# Patient Record
Sex: Female | Born: 1955 | Race: Black or African American | Hispanic: No | Marital: Married | Smoking: Current some day smoker
Health system: Southern US, Community
[De-identification: ages and names within clinical notes are randomized; demographics above are authoritative.]

## PROBLEM LIST (undated history)

## (undated) DIAGNOSIS — J45909 Unspecified asthma, uncomplicated: Secondary | ICD-10-CM

## (undated) DIAGNOSIS — G4730 Sleep apnea, unspecified: Secondary | ICD-10-CM

## (undated) DIAGNOSIS — E7800 Pure hypercholesterolemia, unspecified: Secondary | ICD-10-CM

## (undated) DIAGNOSIS — K219 Gastro-esophageal reflux disease without esophagitis: Secondary | ICD-10-CM

## (undated) DIAGNOSIS — K929 Disease of digestive system, unspecified: Secondary | ICD-10-CM

## (undated) DIAGNOSIS — M2550 Pain in unspecified joint: Secondary | ICD-10-CM

## (undated) DIAGNOSIS — I10 Essential (primary) hypertension: Secondary | ICD-10-CM

## (undated) DIAGNOSIS — E739 Lactose intolerance, unspecified: Secondary | ICD-10-CM

## (undated) HISTORY — PX: TUBAL LIGATION: SHX77

## (undated) HISTORY — DX: Pain in unspecified joint: M25.50

## (undated) HISTORY — PX: TONSILLECTOMY: SUR1361

## (undated) HISTORY — DX: Essential (primary) hypertension: I10

## (undated) HISTORY — DX: Lactose intolerance, unspecified: E73.9

## (undated) HISTORY — PX: ESOPHAGOGASTRODUODENOSCOPY: SHX1529

## (undated) HISTORY — DX: Unspecified asthma, uncomplicated: J45.909

## (undated) HISTORY — DX: Gastro-esophageal reflux disease without esophagitis: K21.9

## (undated) HISTORY — DX: Disease of digestive system, unspecified: K92.9

## (undated) HISTORY — DX: Sleep apnea, unspecified: G47.30

## (undated) HISTORY — DX: Pure hypercholesterolemia, unspecified: E78.00

---

## 2016-01-23 ENCOUNTER — Other Ambulatory Visit: Payer: Self-pay | Admitting: Endocrinology

## 2016-01-23 DIAGNOSIS — E041 Nontoxic single thyroid nodule: Secondary | ICD-10-CM

## 2016-01-28 ENCOUNTER — Ambulatory Visit
Admission: RE | Admit: 2016-01-28 | Discharge: 2016-01-28 | Disposition: A | Discharge status: Home / Self Care | Payer: Managed Care, Other (non HMO) | Source: Ambulatory Visit | Attending: Endocrinology | Admitting: Endocrinology

## 2016-01-28 DIAGNOSIS — E041 Nontoxic single thyroid nodule: Secondary | ICD-10-CM

## 2016-02-28 ENCOUNTER — Other Ambulatory Visit (HOSPITAL_COMMUNITY)
Admission: RE | Admit: 2016-02-28 | Discharge: 2016-02-28 | Disposition: A | Discharge status: Home / Self Care | Payer: Managed Care, Other (non HMO) | Source: Ambulatory Visit | Attending: Internal Medicine | Admitting: Internal Medicine

## 2016-02-28 DIAGNOSIS — Z01419 Encounter for gynecological examination (general) (routine) without abnormal findings: Secondary | ICD-10-CM | POA: Insufficient documentation

## 2016-09-02 ENCOUNTER — Other Ambulatory Visit: Payer: Self-pay | Admitting: Registered Nurse

## 2016-09-02 DIAGNOSIS — Z1231 Encounter for screening mammogram for malignant neoplasm of breast: Secondary | ICD-10-CM

## 2016-09-16 ENCOUNTER — Ambulatory Visit: Payer: Managed Care, Other (non HMO)

## 2016-10-24 ENCOUNTER — Ambulatory Visit
Admission: RE | Admit: 2016-10-24 | Discharge: 2016-10-24 | Disposition: A | Discharge status: Home / Self Care | Payer: Managed Care, Other (non HMO) | Source: Ambulatory Visit | Attending: Registered Nurse | Admitting: Registered Nurse

## 2016-10-24 DIAGNOSIS — Z1231 Encounter for screening mammogram for malignant neoplasm of breast: Secondary | ICD-10-CM

## 2017-05-11 ENCOUNTER — Other Ambulatory Visit: Payer: Self-pay | Admitting: Endocrinology

## 2017-05-11 DIAGNOSIS — E041 Nontoxic single thyroid nodule: Secondary | ICD-10-CM

## 2017-05-20 ENCOUNTER — Ambulatory Visit
Admission: RE | Admit: 2017-05-20 | Discharge: 2017-05-20 | Disposition: A | Discharge status: Home / Self Care | Payer: Managed Care, Other (non HMO) | Source: Ambulatory Visit | Attending: Endocrinology | Admitting: Endocrinology

## 2017-05-20 DIAGNOSIS — E041 Nontoxic single thyroid nodule: Secondary | ICD-10-CM

## 2017-10-06 ENCOUNTER — Other Ambulatory Visit: Payer: Self-pay | Admitting: Registered Nurse

## 2017-10-06 DIAGNOSIS — Z1231 Encounter for screening mammogram for malignant neoplasm of breast: Secondary | ICD-10-CM

## 2017-11-04 ENCOUNTER — Ambulatory Visit: Payer: Managed Care, Other (non HMO)

## 2017-12-01 ENCOUNTER — Ambulatory Visit
Admission: RE | Admit: 2017-12-01 | Discharge: 2017-12-01 | Disposition: A | Discharge status: Home / Self Care | Payer: Managed Care, Other (non HMO) | Source: Ambulatory Visit | Attending: Registered Nurse | Admitting: Registered Nurse

## 2017-12-01 DIAGNOSIS — Z1231 Encounter for screening mammogram for malignant neoplasm of breast: Secondary | ICD-10-CM

## 2018-11-23 ENCOUNTER — Other Ambulatory Visit: Payer: Self-pay | Admitting: Registered Nurse

## 2018-11-23 DIAGNOSIS — Z1231 Encounter for screening mammogram for malignant neoplasm of breast: Secondary | ICD-10-CM

## 2018-12-23 ENCOUNTER — Ambulatory Visit: Payer: Managed Care, Other (non HMO)

## 2019-01-21 ENCOUNTER — Ambulatory Visit
Admission: RE | Admit: 2019-01-21 | Discharge: 2019-01-21 | Disposition: A | Discharge status: Home / Self Care | Payer: Managed Care, Other (non HMO) | Source: Ambulatory Visit | Attending: Registered Nurse | Admitting: Registered Nurse

## 2019-01-21 ENCOUNTER — Other Ambulatory Visit: Payer: Self-pay

## 2019-01-21 DIAGNOSIS — Z1231 Encounter for screening mammogram for malignant neoplasm of breast: Secondary | ICD-10-CM

## 2019-10-04 ENCOUNTER — Other Ambulatory Visit: Payer: Self-pay

## 2019-10-04 DIAGNOSIS — Z20822 Contact with and (suspected) exposure to covid-19: Secondary | ICD-10-CM

## 2019-10-05 LAB — NOVEL CORONAVIRUS, NAA: SARS-CoV-2, NAA: NOT DETECTED

## 2019-11-15 ENCOUNTER — Ambulatory Visit: Payer: Managed Care, Other (non HMO) | Attending: Internal Medicine

## 2019-11-15 DIAGNOSIS — Z20822 Contact with and (suspected) exposure to covid-19: Secondary | ICD-10-CM

## 2019-11-16 LAB — NOVEL CORONAVIRUS, NAA: SARS-CoV-2, NAA: NOT DETECTED

## 2019-12-21 ENCOUNTER — Telehealth: Payer: Managed Care, Other (non HMO) | Admitting: Family

## 2019-12-21 DIAGNOSIS — Z7189 Other specified counseling: Secondary | ICD-10-CM

## 2019-12-21 NOTE — Progress Notes (Signed)
E-Visit for [employer] Virus Screening    Many health care providers can now test patients at their office but not all are.  [facility] has multiple testing sites. For information on our COVID testing locations and hours go to [email or web address]    Testing Information: The COVID-19 Community Testing sites will begin testing BY APPOINTMENT ONLY.  You can schedule online at [email or web address]  If you do not have access to a smart phone or computer you may call [phone] for an appointment.   Additional testing sites in the Community:  . For [facility] Testing sites in [facility]  [email or web address]  . For Pop-up testing sites in [place]  [email or web address]  . For Testing sites with regular hours [email or web address]  . For [facility] [email or web address]  . For [facility] [email or web address]  . For [facility] testing in [place] and [employer] [email or web address]  . For [facility] testing in [facility]   [email or web address]  For  more information about community testing call [phone]   Please quarantine yourself while awaiting your test results. Please stay home for a minimum of 10 days from the first day of illness with improving symptoms and you have had 24 hours of no fever (without the use of Tylenol (Acetaminophen) Motrin (Ibuprofen) or any fever reducing medication).  Also - Do not get tested prior to returning to work because once you have had a positive test the test can stay positive for more then a month in some cases.   You should wear a mask or cloth face covering  over your nose and mouth if you must be around other people or animals, including pets (even at home). Try to stay at least 6 feet away from other people. This will protect the people around you.  Please continue good preventive care measures, including:  frequent hand-washing, avoid touching your face, cover coughs/sneezes, stay out of crowds and keep a 6 foot distance from others.  COVID-19 is a respiratory illness with symptoms that are similar to the flu. Symptoms are typically mild to moderate, but there have been cases of severe illness and death due to the virus.   The following symptoms may appear 2-14 days after exposure: . Fever . Cough . Shortness of breath or difficulty breathing . Chills . Repeated shaking with chills . Muscle pain . Headache . Sore throat . New loss of taste or smell . Fatigue . Congestion or runny nose . Nausea or vomiting . Diarrhea  Go to the nearest hospital ED for assessment if fever/cough/breathlessness are severe or illness seems like a threat to life.  It is vitally important that if you feel that you have an infection such as this virus or any other virus that you stay home and away from places where you may spread it to others.  You should avoid contact with people age 42 and older.    You may also take acetaminophen (Tylenol) as needed for fever.  Reduce your risk of any infection by using the same precautions used for avoiding the common cold or flu:  Marland Kitchen Wash your hands often with soap and warm water for at least 20 seconds.  If soap and water are not readily available, use an alcohol-based hand sanitizer with at least 60% alcohol.  . If coughing or sneezing, cover your mouth and nose by coughing or sneezing into the elbow areas of your shirt or coat, into a tissue or into your sleeve (not your hands). . Avoid shaking  hands with others and consider head nods or verbal greetings only. . Avoid touching your eyes, nose, or mouth with unwashed hands.   . Avoid close contact with people who are sick. . Avoid places or events with large numbers of people in one location, like concerts or sporting events. . Carefully consider travel plans you have or are making. . If you are planning any travel outside or inside the [place], visit the CDC's Travelers' Health webpage for the latest health notices. . If you have some symptoms but not all symptoms, continue to monitor at home and seek medical attention if your symptoms worsen. . If you are having a medical emergency, call 911.  HOME CARE . Only take medications as instructed by your medical team. . Drink plenty of fluids and get plenty of rest. . A steam or ultrasonic humidifier can help if you have congestion.   GET HELP RIGHT AWAY IF YOU HAVE EMERGENCY WARNING SIGNS** FOR COVID-19. If you or someone is showing any of these signs seek emergency medical care immediately. Call 911 or proceed to your closest emergency facility if: . You develop worsening high fever. . Trouble breathing . Bluish lips or face . Persistent pain or pressure in the chest . New confusion . Inability to wake or stay awake . You cough up blood. . Your symptoms become more severe  **This list is not all possible symptoms. Contact your medical provider for any symptoms that are sever or concerning to you.  MAKE SURE YOU   Understand these instructions.  Will watch your condition.  Will get help right away if you are not doing well or get worse.  Your e-visit answers were reviewed by a board certified advanced clinical practitioner to complete your personal care plan.  Depending on the condition, your plan could have included both over the counter or prescription medications.  If there is a problem please reply once you have received a response from your provider.  Your safety is important to [place].  If you have drug allergies check your prescription carefully.    You can use MyChart to ask questions about today's  visit, request a non-urgent call back, or ask for a work or school excuse for 24 hours related to this e-Visit. If it has been greater than 24 hours you will need to follow up with your provider, or enter a new e-Visit to address those concerns. You will get an e-mail in the next two days asking about your experience.  I hope that your e-visit has been valuable and will speed your recovery. Thank you for using e-visits.  Approximately 5 minutes was spent documenting and reviewing patient's chart.

## 2020-02-21 ENCOUNTER — Other Ambulatory Visit: Payer: Self-pay | Admitting: Registered Nurse

## 2020-02-21 DIAGNOSIS — Z1231 Encounter for screening mammogram for malignant neoplasm of breast: Secondary | ICD-10-CM

## 2020-03-01 ENCOUNTER — Encounter: Payer: Self-pay | Admitting: Neurology

## 2020-03-01 ENCOUNTER — Other Ambulatory Visit: Payer: Self-pay | Admitting: Neurology

## 2020-03-02 ENCOUNTER — Ambulatory Visit
Admission: RE | Admit: 2020-03-02 | Discharge: 2020-03-02 | Disposition: A | Discharge status: Home / Self Care | Payer: Managed Care, Other (non HMO) | Source: Ambulatory Visit | Attending: Registered Nurse | Admitting: Registered Nurse

## 2020-03-02 ENCOUNTER — Other Ambulatory Visit: Payer: Self-pay

## 2020-03-02 DIAGNOSIS — Z1231 Encounter for screening mammogram for malignant neoplasm of breast: Secondary | ICD-10-CM

## 2020-03-05 ENCOUNTER — Institutional Professional Consult (permissible substitution): Payer: Self-pay | Admitting: Neurology

## 2020-03-12 ENCOUNTER — Other Ambulatory Visit: Payer: Self-pay

## 2020-03-12 ENCOUNTER — Ambulatory Visit (INDEPENDENT_AMBULATORY_CARE_PROVIDER_SITE_OTHER): Payer: Managed Care, Other (non HMO) | Admitting: Neurology

## 2020-03-12 ENCOUNTER — Encounter: Payer: Self-pay | Admitting: Neurology

## 2020-03-12 VITALS — BP 132/92 | HR 86 | Temp 97.0°F | Ht 64.5 in | Wt 214.0 lb

## 2020-03-12 DIAGNOSIS — E6609 Other obesity due to excess calories: Secondary | ICD-10-CM | POA: Diagnosis not present

## 2020-03-12 DIAGNOSIS — R0683 Snoring: Secondary | ICD-10-CM | POA: Diagnosis not present

## 2020-03-12 DIAGNOSIS — Z9114 Patient's other noncompliance with medication regimen: Secondary | ICD-10-CM

## 2020-03-12 DIAGNOSIS — G518 Other disorders of facial nerve: Secondary | ICD-10-CM

## 2020-03-12 DIAGNOSIS — E66812 Obesity, class 2: Secondary | ICD-10-CM | POA: Insufficient documentation

## 2020-03-12 DIAGNOSIS — Z91199 Patient's noncompliance with other medical treatment and regimen due to unspecified reason: Secondary | ICD-10-CM | POA: Insufficient documentation

## 2020-03-12 DIAGNOSIS — Z6836 Body mass index (BMI) 36.0-36.9, adult: Secondary | ICD-10-CM

## 2020-03-12 DIAGNOSIS — Z6837 Body mass index (BMI) 37.0-37.9, adult: Secondary | ICD-10-CM | POA: Insufficient documentation

## 2020-03-12 NOTE — Progress Notes (Signed)
SLEEP MEDICINE CLINIC    Provider:  Melvyn Novas, MD  Primary Care Physician:  Fatima Sanger, FNP [address] [place] [address] [place] [address]     Referring Provider: Fatima Sanger, Fnp [address] [place],  [place] [address]          Chief Complaint according to patient   Patient presents with:    . New Patient (Initial Visit)     rm 10. pt states she was established with [facility] and they have closed and she is needed to establish with new sleep MD. Last SS 2017 and she uses a CPAP machine set up 11/05/16, [facility]      HISTORY OF PRESENT ILLNESS:  Danielle Mcdonald is a 64 year old  African American female patient seen here as a new patient on 03/12/2020 from Dr. Renne Crigler.  Chief concern according to patient :" I have not been using my CPAP, which was provided by [facility] and need to either get a new test or new supplies." I am having headache" .    I have the pleasure of seeing Danielle Mcdonald 03-12-2020, a right -handed Black or [place] American female with a possible sleep disorder.  She  has no past medical history on file.. She reports that she underwent a sleep study and at [facility] location that belonged to [facility] at the time at that location also closed with in the year of her test.  She must have been diagnosed with obstructive sleep apnea as she was prescribed CPAP in 2017.  To [place] here at [facility] sleep she is a new patient.  She has used over the last 30 days the machine 13 days only and in the last 90 days there was a compliance of about 13%.  This is an air sense 10 machine which is an older model but now the CPAP is set to a pressure of 8 cmH2O was 2 cm EPR and on days used the patient usually vented for 8 hours up to 8 hours and 30 minutes.  The residual AHI is excellent 2.1/h which means that the machine still seems to treat the apnea very well.    There is only minor air leakage noted-  she is using nasal  pillows as an interface.  She reports no daytime sleepiness and very little fatigue, no clinical signs of depression were endorsed, I was able to review Dr. Dorthula Nettles notes which state that the patient has seen her for him for cervicalgia and that he believes that it could be related to her obstructive sleep apnea not being treated at this time.  The patient reports that she still gets supplies from [facility].  White blood cell count in the last documented test was 8.4 hemoglobin hematocrit 11 point 0.7/36.3 platelet count 290 5K comprehensive metabolic panel normal for glucose electrolytes kidney function was intact, normal liver function tests AST and ALT as well as bilirubin, hemoglobin A1c was measured at 5.4 which is normal cholesterol total was 189 which is normal and a urine screen was negative for protein or bacteria or leukocytes.    Sleep relevant medical history: Nocturia: twice, Sleep walking; none, had a Tonsillectomy at age 20. Family medical /sleep history: no other family member diagnosed  with OSA.   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 Family status is married, with  an adult daughter , 16, and 2 grandchildren in [place] . The patient lives with her eldest granddaughter - attending [employer]. Pets are present - one rabbitt.  Tobacco use: 5 cigarettes a month.  ETOH use: none  ,  Caffeine intake in form of Coffee( 1 cup in AM 16 ounces.) Soda( none) Tea ( none ) nor energy drinks. No regular exercise. Hobbies : gardening  Sleep habits are as follows: The patient's dinner time is between 6 PM. The patient goes to bed at 10.15 PM and is immediately asleep, she  continues to sleep for many hours, wakes for 2-5 bathroom breaks, the first time at 2 AM.   The preferred sleep position is on her sides and prone, with the support of 2 pillows.  Dreams are reportedly Infrequent.  7.30AM is the usual rise time. The  patient wakes up spontaneously  At 7  She reports feeling refreshed or restored in AM, with symptoms such as dry mouth , morning headaches ( when not using CPAP ) , and residual fatigue.  Naps are not taken /  Review of Systems: Out of a complete 14 system review, the patient complains of only the following symptoms, and all other reviewed systems are negative.:  Fatigue, sleepiness , snoring, fragmented sleep, Nocturia. Flatulence and burping.   How likely are you to doze in the following situations: 0 = not likely, 1 = slight chance, 2 = moderate chance, 3 = high chance   Sitting and Reading? Watching Television? Sitting inactive in a public place (theater or meeting)? As a passenger in a car for an hour without a break? Lying down in the afternoon when circumstances permit? Sitting and talking to someone? Sitting quietly after lunch without alcohol? In a car, while stopped for a few minutes in traffic?   Total = 0/ 24 points   FSS endorsed at 13/ 63 points.   Social History   Socioeconomic History  . Marital status: Married    Spouse name: Not on file  . Number of children: Not on file  . Years of education: Not on file  . Highest education level: Not on file  Occupational History  . Not on file  Tobacco Use  . Smoking status: Current Some Day Smoker    Types: Cigarettes  . Smokeless tobacco: Never Used  . Tobacco comment: once a week  Substance and Sexual Activity  . Alcohol use: Not Currently    Comment: rare   . Drug use: Never  . Sexual activity: Not on file  Other Topics Concern  . Not on file  Social History Narrative  . Not on file   Social Determinants of Health   Financial Resource Strain:   . Difficulty of Paying Living Expenses:   Food Insecurity:   . Worried About Charity fundraiser in the Last Year:   . Arboriculturist in the Last Year:   Transportation Needs:   . Film/video editor (Medical):   Marland Kitchen Lack of Transportation (Non-Medical):    Physical Activity:   . Days of Exercise per Week:   . Minutes of Exercise per Session:   Stress:   . Feeling of Stress : mother is 29 and living in [place].   Social Connections:   . Frequency of Communication with Friends and Family:   . Frequency of Social Gatherings with Friends and Family:   . Attends Religious Services:   . Active Member of Clubs or Organizations:   . Attends  Club or Organization Meetings:   Marland Kitchen Marital Status: married, adult daughter, 3 grandchildren.     Family History  Problem Relation Age of Onset  . Thyroid disease Mother   . Diabetes Mother   . Osteoarthritis Mother   . Hypertension Mother   . Heart disease Mother   . Lung cancer Father     No past medical history on file.  Past Surgical History:  Procedure Laterality Date  . TONSILLECTOMY    . TUBAL LIGATION       Current Outpatient Medications on File Prior to Visit  Medication Sig Dispense Refill  . albuterol (VENTOLIN HFA) 108 (90 Base) MCG/ACT inhaler albuterol sulfate HFA 90 mcg/actuation aerosol inhaler  TAKE 2 PUFFS BY MOUTH EVERY 4 HOURS AS NEEDED    . Cholecalciferol (VITAMIN D3 PO) Take 1,000 Units by mouth daily.    . cyclobenzaprine (FLEXERIL) 5 MG tablet Take 5 mg by mouth daily as needed.    Marland Kitchen lisinopril (ZESTRIL) 10 MG tablet Take 10 mg by mouth daily.    . Multiple Vitamins-Minerals (WOMENS MULTIVITAMIN PO) Take by mouth.    . pravastatin (PRAVACHOL) 40 MG tablet pravastatin 40 mg tablet     No current facility-administered medications on file prior to visit.    Allergies  Allergen Reactions  . Sulfa Antibiotics     Physical exam:  Today's Vitals   03/12/20 1437  BP: (!) 132/92  Pulse: 86  Temp: (!) 97 F (36.1 C)  Weight: 214 lb (97.1 kg)  Height: 5' 4.5" (1.638 m)   Body mass index is 36.17 kg/m.   Wt Readings from Last 3 Encounters:  03/12/20 214 lb (97.1 kg)     Ht Readings from Last 3 Encounters:  03/12/20 5' 4.5" (1.638 m)      General: The patient  is awake, alert and appears not in acute distress.  The patient is well groomed. Head: Normocephalic, atraumatic. Neck is supple. Mallampati 1,  neck circumference:15 inches . Nasal airflow patent.  Retrognathia is not noted .  Dental status: intact  Cardiovascular:  Regular rate and cardiac rhythm by pulse,  without distended neck veins. Respiratory: Lungs are clear to auscultation.  Skin:  Without evidence of ankle edema, or rash. Trunk: The patient's posture is erect.   Neurologic exam : The patient is awake and alert, oriented to place and time.   Memory subjective described as intact.  Attention span & concentration ability appears normal.  Speech is fluent,  with dysphonia.  Mood and affect are appropriate.   Cranial nerves: no loss of smell or taste reported  Pupils are equal and briskly reactive to light. Funduscopic exam deferred.  Extraocular movements in vertical and horizontal planes were intact and without nystagmus. No Diplopia. Visual fields by finger perimetry are intact. Hearing was intact to soft voice and finger rubbing.   Facial sensation intact to fine touch. Facial motor strength is symmetric and tongue and uvula move midline. Neck ROM : rotation, tilt and flexion extension were normal for age and shoulder shrug was symmetrical.    Motor exam:  Symmetric bulk, tone and ROM.   Normal tone without cog wheeling, symmetric grip strength . Sensory:  Fine touch, pinprick and vibration were tested  and  normal.  Proprioception tested in the upper extremities was normal.  Coordination: Rapid alternating movements in the fingers/hands were of normal speed.  The Finger-to-nose maneuver was intact without evidence of ataxia, dysmetria or tremor.   Gait and station: Patient  could rise unassisted from a seated position, walked without assistive device.  Toe and heel walk were deferred.  Deep tendon reflexes: in the  upper and lower extremities are symmetric and intact.   Babinski response was deferred.     After spending a total time of 45 minutes face to face and additional time for physical and neurologic examination, review of laboratory studies,  personal review of reports and results of other testing and review of referral information / records as far as provided in visit, I have established the following assessments: There is no access to original sleep study.   1) in order to provide new supplies , a compliance record has to improve, I am surprised to find that she still gets supplies through [facility].   2) BMI is a risk factor, patient has been struggling with weight gain when she first stopped smoking .  3) Cervicalgia / headches- not present today.  sharp, stinging" headaches. I suspect that CPAP non compliance is a contributing factor.     My Plan is to proceed with:   1) HST and we can start to order supplies through [facility]. Machine is 81.62 years old and cannot be replaced.  2)  Referral to medical weight and wellness.  3) Headaches are described to arise from many different spots at different times, and creating a sensation of an electric shock sensation. Not nauseated. No photophobia - not a vascular headache, not restricted to any time of day, and not a morning headache type.   I would like to thank Larose Hires Gaylyn Lambert, FNP and Dr. Renne Crigler, [address] [place],  [place] [address] for allowing me to meet with and to take care of this pleasant patient.   In short, Danielle Mcdonald is presenting with a highly unspecific headache, not migrainous , not tension. - may be neuralgic.Headache frequency is less than once a week. Not a candidate for preventive medication.   She is established with [facility] and has a rater new machine , she can follow here in 18 month to replace it, but I will insist on a HST or PSG  Before replacing the machine.      I plan to follow up either personally or through our NP within 12-18 month.       Electronically signed by: Melvyn Novas, MD 03/12/2020 2:58 PM  [facility] Board certified by The [employer] of Sleep Medicine and Diplomate of the [employer] of Sleep Medicine. Board certified In Neurology through the ABPN, Fellow of the [employer] of [employer]. Medical Director of [facility].

## 2020-03-12 NOTE — Patient Instructions (Signed)

## 2020-03-21 LAB — BASIC METABOLIC PANEL
BUN: 10 (ref 4–21)
CO2: 27 — AB (ref 13–22)
Chloride: 107 (ref 99–108)
Creatinine: 0.8 (ref 0.5–1.1)
Glucose: 92
Potassium: 4.4 (ref 3.4–5.3)
Sodium: 143 (ref 137–147)

## 2020-03-21 LAB — LIPID PANEL
Cholesterol: 178 (ref 0–200)
HDL: 48 (ref 35–70)
LDL Cholesterol: 115
LDl/HDL Ratio: 2.4
Triglycerides: 73 (ref 40–160)

## 2020-03-21 LAB — COMPREHENSIVE METABOLIC PANEL
Albumin: 3.8 (ref 3.5–5.0)
Calcium: 9.2 (ref 8.7–10.7)
GFR calc Af Amer: 93.29
GFR calc non Af Amer: 77.1
Globulin: 3.2

## 2020-03-21 LAB — CBC AND DIFFERENTIAL
HCT: 36 (ref 36–46)
Hemoglobin: 11.7 — AB (ref 12.0–16.0)
Platelets: 288 (ref 150–399)
WBC: 7.7

## 2020-03-21 LAB — VITAMIN D 25 HYDROXY (VIT D DEFICIENCY, FRACTURES): Vit D, 25-Hydroxy: 40.2

## 2020-03-21 LAB — TSH: TSH: 2.54 (ref 0.41–5.90)

## 2020-03-21 LAB — HEPATIC FUNCTION PANEL
ALT: 29 (ref 7–35)
AST: 19 (ref 13–35)
Bilirubin, Total: 0.4

## 2020-03-21 LAB — CBC: RBC: 4.17 (ref 3.87–5.11)

## 2020-03-23 ENCOUNTER — Other Ambulatory Visit: Payer: Self-pay | Admitting: Gastroenterology

## 2020-03-23 DIAGNOSIS — R1013 Epigastric pain: Secondary | ICD-10-CM

## 2020-03-27 ENCOUNTER — Ambulatory Visit
Admission: RE | Admit: 2020-03-27 | Discharge: 2020-03-27 | Disposition: A | Discharge status: Home / Self Care | Payer: Managed Care, Other (non HMO) | Source: Ambulatory Visit | Attending: Gastroenterology | Admitting: Gastroenterology

## 2020-03-27 DIAGNOSIS — R1013 Epigastric pain: Secondary | ICD-10-CM

## 2020-05-01 ENCOUNTER — Encounter (INDEPENDENT_AMBULATORY_CARE_PROVIDER_SITE_OTHER): Payer: Self-pay | Admitting: Family Medicine

## 2020-05-01 ENCOUNTER — Other Ambulatory Visit: Payer: Self-pay

## 2020-05-01 ENCOUNTER — Ambulatory Visit (INDEPENDENT_AMBULATORY_CARE_PROVIDER_SITE_OTHER): Payer: Managed Care, Other (non HMO) | Admitting: Family Medicine

## 2020-05-01 VITALS — BP 140/83 | HR 69 | Temp 98.2°F | Ht 63.0 in | Wt 214.8 lb

## 2020-05-01 DIAGNOSIS — I10 Essential (primary) hypertension: Secondary | ICD-10-CM | POA: Diagnosis not present

## 2020-05-01 DIAGNOSIS — R5383 Other fatigue: Secondary | ICD-10-CM

## 2020-05-01 DIAGNOSIS — E7849 Other hyperlipidemia: Secondary | ICD-10-CM | POA: Diagnosis not present

## 2020-05-01 DIAGNOSIS — Z6838 Body mass index (BMI) 38.0-38.9, adult: Secondary | ICD-10-CM

## 2020-05-01 DIAGNOSIS — R0602 Shortness of breath: Secondary | ICD-10-CM

## 2020-05-01 DIAGNOSIS — Z9189 Other specified personal risk factors, not elsewhere classified: Secondary | ICD-10-CM

## 2020-05-01 DIAGNOSIS — Z1331 Encounter for screening for depression: Secondary | ICD-10-CM

## 2020-05-01 DIAGNOSIS — E559 Vitamin D deficiency, unspecified: Secondary | ICD-10-CM

## 2020-05-01 DIAGNOSIS — Z0289 Encounter for other administrative examinations: Secondary | ICD-10-CM

## 2020-05-03 LAB — CBC WITH DIFFERENTIAL/PLATELET
Basophils Absolute: 0 10*3/uL (ref 0.0–0.2)
Basos: 0 %
EOS (ABSOLUTE): 0.4 10*3/uL (ref 0.0–0.4)
Eos: 6 %
Hematocrit: 36.2 % (ref 34.0–46.6)
Hemoglobin: 12.1 g/dL (ref 11.1–15.9)
Immature Grans (Abs): 0 10*3/uL (ref 0.0–0.1)
Immature Granulocytes: 0 %
Lymphocytes Absolute: 3 10*3/uL (ref 0.7–3.1)
Lymphs: 40 %
MCH: 27.8 pg (ref 26.6–33.0)
MCHC: 33.4 g/dL (ref 31.5–35.7)
MCV: 83 fL (ref 79–97)
Monocytes Absolute: 0.5 10*3/uL (ref 0.1–0.9)
Monocytes: 7 %
Neutrophils Absolute: 3.5 10*3/uL (ref 1.4–7.0)
Neutrophils: 47 %
Platelets: 286 10*3/uL (ref 150–450)
RBC: 4.36 x10E6/uL (ref 3.77–5.28)
RDW: 12.8 % (ref 11.7–15.4)
WBC: 7.5 10*3/uL (ref 3.4–10.8)

## 2020-05-03 LAB — VITAMIN B12

## 2020-05-03 LAB — LIPID PANEL WITH LDL/HDL RATIO

## 2020-05-03 LAB — VITAMIN D 25 HYDROXY (VIT D DEFICIENCY, FRACTURES)

## 2020-05-03 LAB — HEMOGLOBIN A1C
Est. average glucose Bld gHb Est-mCnc: 103 mg/dL
Hgb A1c MFr Bld: 5.2 % (ref 4.8–5.6)

## 2020-05-03 LAB — T4, FREE

## 2020-05-03 LAB — COMPREHENSIVE METABOLIC PANEL

## 2020-05-03 LAB — TSH

## 2020-05-03 LAB — T3

## 2020-05-03 LAB — FOLATE

## 2020-05-03 LAB — INSULIN, RANDOM

## 2020-05-03 NOTE — Progress Notes (Signed)
Chief Complaint:   OBESITY Danielle Mcdonald (MR# [id number]) is a 64 y.o. female who presents for evaluation and treatment of obesity and related comorbidities. Current BMI is Body mass index is 38.05 kg/m. Danielle Mcdonald has been struggling with her weight for many years and has been unsuccessful in either losing weight, maintaining weight loss, or reaching her healthy weight goal.  Danielle Mcdonald is currently in the action stage of change and ready to dedicate time achieving and maintaining a healthier weight. Danielle Mcdonald is interested in becoming our patient and working on intensive lifestyle modifications including (but not limited to) diet and exercise for weight loss.  Danielle Mcdonald's habits were reviewed today and are as follows: Her family eats meals together, her desired weight loss is 39 lbs, she started gaining weight with each of the 3 times quitting smoking, her heaviest weight ever was 220 pounds, she is a picky eater and doesn't like to eat healthier foods, she has significant food cravings issues, she snacks frequently in the evenings, she skips meals frequently, she frequently eats larger portions than normal and she struggles with emotional eating.  Depression Screen Danielle Mcdonald's Food and Mood (modified PHQ-9) score was 5.  Depression screen [facility] 2/9 05/01/2020  Decreased Interest 1  Down, Depressed, Hopeless 0  PHQ - 2 Score 1  Altered sleeping 1  Tired, decreased energy 1  Change in appetite 1  Feeling bad or failure about yourself  0  Trouble concentrating 0  Moving slowly or fidgety/restless 1  Suicidal thoughts 0  PHQ-9 Score 5  Difficult doing work/chores Not difficult at all   Subjective:   1. Other fatigue Danielle Mcdonald admits to daytime somnolence and denies waking up still tired. Patent has a history of symptoms of daytime fatigue and morning headache. Danielle Mcdonald generally gets 7 or 8 hours of sleep per night, and states that she has generally restful sleep. Snoring is present. Apneic  episodes are present. Epworth Sleepiness Score is 2.  2. SOB (shortness of breath) on exertion Danielle Mcdonald notes increasing shortness of breath with exercising and seems to be worsening over time with weight gain. She notes getting out of breath sooner with activity than she used to. This has not gotten worse recently. Danielle Mcdonald denies shortness of breath at rest or orthopnea.  3. Essential hypertension Danielle Mcdonald is on lisinopril, and her blood pressure is slightly elevated today. She denies chest pain or headache. She notes a history of obstructive sleep apnea.  4. Other hyperlipidemia Danielle Mcdonald is on statin, and she is working on diet and lifestyle changes. She denies chest pain.  5. Vitamin D deficiency Danielle Mcdonald is on OTC Vit D, and she has no recent labs in Epic. She notes fatigue.  6. At risk for heart disease Danielle Mcdonald is at a higher than average risk for cardiovascular disease due to obesity.   Assessment/Plan:   1. Other fatigue [name] does feel that her weight is causing her energy to be lower than it should be. Fatigue may be related to obesity, depression or many other causes. Labs will be ordered, and in the meanwhile, Emmily will focus on self care including making healthy food choices, increasing physical activity and focusing on stress reduction.  - EKG 12-Lead - Vitamin B12 - Folate - T3 - T4, free - TSH  2. SOB (shortness of breath) on exertion Danielle Mcdonald does feel that she gets out of breath more easily that she used to when she exercises. Danielle Mcdonald's shortness of breath appears to be obesity related and  exercise induced. She has agreed to work on weight loss and gradually increase exercise to treat her exercise induced shortness of breath. Will continue to monitor closely.  - Vitamin B12 - Folate - T3 - T4, free - TSH  3. Essential hypertension Danielle Mcdonald will start her Category 2 plan, and will continue working on healthy weight loss and exercise to improve blood pressure  control. We will watch for signs of hypotension as she continues her lifestyle modifications. We will check labs today.  - Comprehensive metabolic panel - CBC with Differential/Platelet  4. Other hyperlipidemia Cardiovascular risk and specific lipid/LDL goals reviewed. We discussed several lifestyle modifications today. Toi will start her Category 2 plan, and will continue to work on exercise and weight loss efforts. We will check labs today. Orders and follow up as documented in patient record.   - Hemoglobin A1c - Insulin, random - Lipid Panel With LDL/HDL Ratio  5. Vitamin D deficiency Low Vitamin D level contributes to fatigue and are associated with obesity, breast, and colon cancer. We will check labs today. Danielle Mcdonald will follow-up for routine testing of Vitamin D, at least 2-3 times per year to avoid over-replacement.  - VITAMIN D 25 Hydroxy (Vit-D Deficiency, Fractures)  6. Depression screening Danielle Mcdonald had a positive depression screening. Depression is commonly associated with obesity and often results in emotional eating behaviors. We will monitor this closely and work on CBT to help improve the non-hunger eating patterns. Referral to Psychology may be required if no improvement is seen as she continues in our clinic.  7. At risk for heart disease Danielle Mcdonald was given approximately 30 minutes of coronary artery disease prevention counseling today. She is 64 y.o. female and has risk factors for heart disease including obesity. We discussed intensive lifestyle modifications today with an emphasis on specific weight loss instructions and strategies.   Repetitive spaced learning was employed today to elicit superior memory formation and behavioral change.  8. Class 2 severe obesity with serious comorbidity and body mass index (BMI) of 38.0 to 38.9 in adult, unspecified obesity type (HCC) Danielle Mcdonald is currently in the action stage of change and her goal is to continue with weight loss  efforts. I recommend Erna begin the structured treatment plan as follows:  She has agreed to the Category 2 Plan.  Exercise goals: No exercise has been prescribed for now, while we concentrate on nutritional changes.   Behavioral modification strategies: meal planning and cooking strategies.  She was informed of the importance of frequent follow-up visits to maximize her success with intensive lifestyle modifications for her multiple health conditions. She was informed we would discuss her lab results at her next visit unless there is a critical issue that needs to be addressed sooner. Shadoe agreed to keep her next visit at the agreed upon time to discuss these results.  Objective:   Blood pressure 140/83, pulse 69, temperature 98.2 F (36.8 C), temperature source Oral, height 5\' 3"  (1.6 m), weight 214 lb 12.8 oz (97.4 kg), SpO2 100 %. Body mass index is 38.05 kg/m.  EKG: Normal sinus rhythm, rate 67 BPM.  Indirect Calorimeter completed today shows a VO2 of 223 and a REE of 1553.  Her calculated basal metabolic rate is thus her basal metabolic rate is worse than expected.  General: Cooperative, alert, well developed, in no acute distress. HEENT: Conjunctivae and lids unremarkable. Cardiovascular: Regular rhythm.  Lungs: Normal work of breathing. Neurologic: No focal deficits.   Lab Results  Component Value Date  CREATININE WILL FOLLOW 05/01/2020   BUN WILL FOLLOW 05/01/2020   NA WILL FOLLOW 05/01/2020   K WILL FOLLOW 05/01/2020   CL WILL FOLLOW 05/01/2020   CO2 WILL FOLLOW 05/01/2020   Lab Results  Component Value Date   ALT WILL FOLLOW 05/01/2020   AST WILL FOLLOW 05/01/2020   ALKPHOS WILL FOLLOW 05/01/2020   BILITOT WILL FOLLOW 05/01/2020   Lab Results  Component Value Date   HGBA1C 5.2 05/01/2020   Lab Results  Component Value Date   INSULIN WILL FOLLOW 05/01/2020   Lab Results  Component Value Date   TSH WILL FOLLOW 05/01/2020   Lab Results    Component Value Date   CHOL WILL FOLLOW 05/01/2020   HDL WILL FOLLOW 05/01/2020   LDLCALC WILL FOLLOW 05/01/2020   TRIG WILL FOLLOW 05/01/2020   Lab Results  Component Value Date   WBC 7.5 05/01/2020   HGB 12.1 05/01/2020   HCT 36.2 05/01/2020   MCV 83 05/01/2020   PLT 286 05/01/2020   No results found for: IRON, TIBC, FERRITIN  Attestation Statements:   Reviewed by clinician on day of visit: allergies, medications, problem list, medical history, surgical history, family history, social history, and previous encounter notes.   I, Trixie Dredge, am acting as transcriptionist for Dennard Nip, MD.  I have reviewed the above documentation for accuracy and completeness, and I agree with the above. - Dennard Nip, MD

## 2020-05-03 NOTE — Progress Notes (Signed)
Can we check into this?

## 2020-05-07 NOTE — Progress Notes (Signed)
Ok, please inform her.

## 2020-05-07 NOTE — Progress Notes (Signed)
Unfortunately the specimen spilled in route and could not resulted. She will need to be repeated.   Dmarion Perfect, CMA

## 2020-05-08 NOTE — Progress Notes (Signed)
Patient informed.   Danielle Mcdonald, CMA

## 2020-05-10 ENCOUNTER — Encounter (INDEPENDENT_AMBULATORY_CARE_PROVIDER_SITE_OTHER): Payer: Self-pay

## 2020-05-15 ENCOUNTER — Ambulatory Visit (INDEPENDENT_AMBULATORY_CARE_PROVIDER_SITE_OTHER): Payer: Managed Care, Other (non HMO) | Admitting: Family Medicine

## 2020-05-15 ENCOUNTER — Other Ambulatory Visit: Payer: Self-pay

## 2020-05-15 ENCOUNTER — Encounter (INDEPENDENT_AMBULATORY_CARE_PROVIDER_SITE_OTHER): Payer: Self-pay | Admitting: Family Medicine

## 2020-05-15 VITALS — BP 114/78 | HR 85 | Temp 97.9°F | Ht 63.0 in | Wt 210.0 lb

## 2020-05-15 DIAGNOSIS — R5383 Other fatigue: Secondary | ICD-10-CM | POA: Diagnosis not present

## 2020-05-15 DIAGNOSIS — Z6837 Body mass index (BMI) 37.0-37.9, adult: Secondary | ICD-10-CM | POA: Diagnosis not present

## 2020-05-15 DIAGNOSIS — R739 Hyperglycemia, unspecified: Secondary | ICD-10-CM | POA: Diagnosis not present

## 2020-05-16 NOTE — Progress Notes (Signed)
Chief Complaint:   OBESITY Danielle Mcdonald is here to discuss her progress with her obesity treatment plan along with follow-up of her obesity related diagnoses. [name] is on the Category 2 Plan and states she is following her eating plan approximately 75% of the time. [name] states she is doing 0 minutes 0 times per week.  Today's visit was #: 2 Starting weight: 214 lbs Starting date: 05/01/2020 Today's weight: 210 lbs Today's date: 05/15/2020 Total lbs lost to date: 4 Total lbs lost since last in-office visit: 4  Interim History: [name] has done well with weight loss on her Category 2 plan even over the July 4th holiday. She struggled with having her 2 young grandkids the last week but she still did well overall. She would like more dinner options.  Subjective:   1. Hyperglycemia [name] shows signs of insulin resistance with decreased morning hunger and increased PM polyphagia. Her labs were drawn but accidentally destroyed so no results to review today.  2. Other fatigue [name] had labs checked at her last visit, but her labs were destroyed and she needs to have them redrawn.  Assessment/Plan:   1. Hyperglycemia Fasting labs will be obtained today, and results with be discussed with Danielle Mcdonald in 2 weeks at her follow up visit. In the meanwhile Danielle Mcdonald will continue her modified Category 2 plan and will work on weight loss efforts.  - Comprehensive metabolic panel - Hemoglobin A1c - Insulin, random  2. Other fatigue We will check labs today and [place] will follow up as directed.  - Comprehensive metabolic panel - Lipid Panel With LDL/HDL Ratio - VITAMIN D 25 Hydroxy (Vit-D Deficiency, Fractures) - Vitamin B12 - Folate - T3 - T4, free - TSH  3. Class 2 severe obesity with serious comorbidity and body mass index (BMI) of 37.0 to 37.9 in adult, unspecified obesity type (HCC) Danielle Mcdonald is currently in the action stage of change. As such, her goal is to continue with weight  loss efforts. She has agreed to the Category 2 Plan and keeping a food journal and adhering to recommended goals of 400-550 calories and 35+ grams of protein at supper daily.   Behavioral modification strategies: increasing lean protein intake, no skipping meals and dealing with family or coworker sabotage.  Danielle Mcdonald has agreed to follow-up with our clinic in 2 weeks. She was informed of the importance of frequent follow-up visits to maximize her success with intensive lifestyle modifications for her multiple health conditions.   Danielle Mcdonald was informed we would discuss her lab results at her next visit unless there is a critical issue that needs to be addressed sooner. Danielle Mcdonald agreed to keep her next visit at the agreed upon time to discuss these results.  Objective:   Blood pressure 114/78, pulse 85, temperature 97.9 F (36.6 C), temperature source Oral, height 5\' 3"  (1.6 m), weight 210 lb (95.3 kg), SpO2 97 %. Body mass index is 37.2 kg/m.  General: Cooperative, alert, well developed, in no acute distress. HEENT: Conjunctivae and lids unremarkable. Cardiovascular: Regular rhythm.  Lungs: Normal work of breathing. Neurologic: No focal deficits.   Lab Results  Component Value Date   CREATININE 0.8 03/21/2020   BUN 10 03/21/2020   NA 143 03/21/2020   K 4.4 03/21/2020   CL 107 03/21/2020   CO2 27 (A) 03/21/2020   Lab Results  Component Value Date   ALT 29 03/21/2020   AST 19 03/21/2020   Lab Results  Component Value Date   HGBA1C  5.2 05/01/2020   Lab Results  Component Value Date   INSULIN CANCELED 05/01/2020   Lab Results  Component Value Date   TSH CANCELED 05/01/2020   Lab Results  Component Value Date   CHOL CANCELED 05/01/2020   HDL 48 03/21/2020   LDLCALC 115 03/21/2020   TRIG 73 03/21/2020   Lab Results  Component Value Date   WBC 7.5 05/01/2020   HGB 12.1 05/01/2020   HCT 36.2 05/01/2020   MCV 83 05/01/2020   PLT 286 05/01/2020   No results found for:  IRON, TIBC, FERRITIN  Attestation Statements:   Reviewed by clinician on day of visit: allergies, medications, problem list, medical history, surgical history, family history, social history, and previous encounter notes.  Time spent on visit including pre-visit chart review and post-visit care and charting was 45 minutes.    I, Burt Knack, am acting as transcriptionist for Quillian Quince, MD.  I have reviewed the above documentation for accuracy and completeness, and I agree with the above. -  Quillian Quince, MD

## 2020-05-18 LAB — LIPID PANEL WITH LDL/HDL RATIO
Cholesterol, Total: 175 mg/dL (ref 100–199)
HDL: 39 mg/dL — ABNORMAL LOW (ref >39)
LDL Chol Calc (NIH): 118 mg/dL — ABNORMAL HIGH (ref 0–99)
LDL/HDL Ratio: 3 ratio (ref 0.0–3.2)
Triglycerides: 95 mg/dL (ref 0–149)
VLDL Cholesterol Cal: 18 mg/dL (ref 5–40)

## 2020-05-18 LAB — COMPREHENSIVE METABOLIC PANEL
ALT: 20 IU/L (ref 0–32)
AST: 20 IU/L (ref 0–40)
Albumin/Globulin Ratio: 1.5 (ref 1.2–2.2)
Albumin: 4 g/dL (ref 3.8–4.8)
Alkaline Phosphatase: 65 IU/L (ref 48–121)
BUN/Creatinine Ratio: 18 (ref 12–28)
BUN: 14 mg/dL (ref 8–27)
Bilirubin Total: 0.5 mg/dL (ref 0.0–1.2)
CO2: 26 mmol/L (ref 20–29)
Calcium: 9.2 mg/dL (ref 8.7–10.3)
Chloride: 104 mmol/L (ref 96–106)
Creatinine, Ser: 0.8 mg/dL (ref 0.57–1.00)
GFR calc Af Amer: 92 mL/min/{1.73_m2} (ref >59)
GFR calc non Af Amer: 80 mL/min/{1.73_m2} (ref >59)
Globulin, Total: 2.7 g/dL (ref 1.5–4.5)
Glucose: 94 mg/dL (ref 65–99)
Potassium: 4.5 mmol/L (ref 3.5–5.2)
Sodium: 140 mmol/L (ref 134–144)
Total Protein: 6.7 g/dL (ref 6.0–8.5)

## 2020-05-18 LAB — HEMOGLOBIN A1C
Est. average glucose Bld gHb Est-mCnc: 105 mg/dL
Hgb A1c MFr Bld: 5.3 % (ref 4.8–5.6)

## 2020-05-18 LAB — FOLATE: Folate: 8.4 ng/mL (ref >3.0)

## 2020-05-18 LAB — INSULIN, RANDOM: INSULIN: 8.9 u[IU]/mL (ref 2.6–24.9)

## 2020-05-18 LAB — T4, FREE: Free T4: 1.21 ng/dL (ref 0.82–1.77)

## 2020-05-18 LAB — VITAMIN B12: Vitamin B-12: 672 pg/mL (ref 232–1245)

## 2020-05-18 LAB — VITAMIN D 25 HYDROXY (VIT D DEFICIENCY, FRACTURES): Vit D, 25-Hydroxy: 40.2 ng/mL (ref 30.0–100.0)

## 2020-05-18 LAB — TSH: TSH: 1.55 u[IU]/mL (ref 0.450–4.500)

## 2020-05-18 LAB — T3: T3, Total: 91 ng/dL (ref 71–180)

## 2020-05-28 ENCOUNTER — Other Ambulatory Visit: Payer: Self-pay

## 2020-05-28 ENCOUNTER — Encounter (INDEPENDENT_AMBULATORY_CARE_PROVIDER_SITE_OTHER): Payer: Self-pay | Admitting: Family Medicine

## 2020-05-28 ENCOUNTER — Ambulatory Visit (INDEPENDENT_AMBULATORY_CARE_PROVIDER_SITE_OTHER): Payer: Managed Care, Other (non HMO) | Admitting: Family Medicine

## 2020-05-28 VITALS — BP 119/80 | HR 81 | Temp 98.1°F | Ht 63.0 in | Wt 206.0 lb

## 2020-05-28 DIAGNOSIS — Z6836 Body mass index (BMI) 36.0-36.9, adult: Secondary | ICD-10-CM | POA: Diagnosis not present

## 2020-05-28 DIAGNOSIS — E559 Vitamin D deficiency, unspecified: Secondary | ICD-10-CM

## 2020-05-28 DIAGNOSIS — E7849 Other hyperlipidemia: Secondary | ICD-10-CM | POA: Diagnosis not present

## 2020-05-30 NOTE — Progress Notes (Signed)
Chief Complaint:   OBESITY Danielle Mcdonald is here to discuss her progress with her obesity treatment plan along with follow-up of her obesity related diagnoses. Danielle Mcdonald is on the Category 2 Plan and keeping a food journal and adhering to recommended goals of 400-550 calories and 35+ grams of protein at supper daily and states she is following her eating plan approximately 75% of the time. Danielle Mcdonald states she is doing 0 minutes 0 times per week.  Today's visit was #: 3 Starting weight: 214 lbs Starting date: 05/01/2020 Today's weight: 206 lbs Today's date: 05/28/2020 Total lbs lost to date: 8 Total lbs lost since last in-office visit: 4  Interim History: Danielle Mcdonald continues to do well with weight loss on her plan. She struggles to eat all of her breakfast at times and she notes her hunger is increased on these days.  Subjective:   1. Other hyperlipidemia Danielle Mcdonald is on pravastatin, and her LDL is still >100. She is working on diet and weight loss. She denies chest pain. I discussed labs with the patient today.  2. Vitamin D deficiency Danielle Mcdonald is on OTC Vit D, but her level is not yet at goal. She notes fatigue. I discussed labs with the patient today.  Assessment/Plan:   1. Other hyperlipidemia Cardiovascular risk and specific lipid/LDL goals reviewed. We discussed several lifestyle modifications today. Danielle Mcdonald will continue her medications and eating plan, and will continue to work on exercise and weight loss efforts. We will recheck labs in 3 months. Orders and follow up as documented in patient record.   2. Vitamin D deficiency Low Vitamin D level contributes to fatigue and are associated with obesity, breast, and colon cancer. Danielle Mcdonald agreed to continue taking OTC Vitamin D and will follow-up for routine testing of Vitamin D, at least 2-3 times per year to avoid over-replacement. We will recheck labs in 3 months.  3. Class 2 severe obesity with serious comorbidity and body mass index  (BMI) of 36.0 to 36.9 in adult, unspecified obesity type (HCC) Danielle Mcdonald is currently in the action stage of change. As such, her goal is to continue with weight loss efforts. She has agreed to the Category 2 Plan and keeping a food journal and adhering to recommended goals of 400-550 calories and 35+ grams of protein at supper daily.   Behavioral modification strategies: no skipping meals.  Danielle Mcdonald has agreed to follow-up with our clinic in 2 weeks. She was informed of the importance of frequent follow-up visits to maximize her success with intensive lifestyle modifications for her multiple health conditions.   Objective:   Blood pressure 119/80, pulse 81, temperature 98.1 F (36.7 C), temperature source Oral, height 5\' 3"  (1.6 m), weight 206 lb (93.4 kg), SpO2 100 %. Body mass index is 36.49 kg/m.  General: Cooperative, alert, well developed, in no acute distress. HEENT: Conjunctivae and lids unremarkable. Cardiovascular: Regular rhythm.  Lungs: Normal work of breathing. Neurologic: No focal deficits.   Lab Results  Component Value Date   CREATININE 0.80 05/17/2020   BUN 14 05/17/2020   NA 140 05/17/2020   K 4.5 05/17/2020   CL 104 05/17/2020   CO2 26 05/17/2020   Lab Results  Component Value Date   ALT 20 05/17/2020   AST 20 05/17/2020   ALKPHOS 65 05/17/2020   BILITOT 0.5 05/17/2020   Lab Results  Component Value Date   HGBA1C 5.3 05/17/2020   HGBA1C 5.2 05/01/2020   Lab Results  Component Value Date   INSULIN  8.9 05/17/2020   INSULIN CANCELED 05/01/2020   Lab Results  Component Value Date   TSH 1.550 05/17/2020   Lab Results  Component Value Date   CHOL 175 05/17/2020   HDL 39 (L) 05/17/2020   LDLCALC 118 (H) 05/17/2020   TRIG 95 05/17/2020   Lab Results  Component Value Date   WBC 7.5 05/01/2020   HGB 12.1 05/01/2020   HCT 36.2 05/01/2020   MCV 83 05/01/2020   PLT 286 05/01/2020   No results found for: IRON, TIBC, FERRITIN  Attestation  Statements:   Reviewed by clinician on day of visit: allergies, medications, problem list, medical history, surgical history, family history, social history, and previous encounter notes.  Time spent on visit including pre-visit chart review and post-visit care and charting was 30 minutes.    I, Burt Knack, am acting as transcriptionist for Quillian Quince, MD.  I have reviewed the above documentation for accuracy and completeness, and I agree with the above. -  Quillian Quince, MD

## 2020-06-11 ENCOUNTER — Encounter (INDEPENDENT_AMBULATORY_CARE_PROVIDER_SITE_OTHER): Payer: Self-pay | Admitting: Family Medicine

## 2020-06-11 ENCOUNTER — Other Ambulatory Visit: Payer: Self-pay

## 2020-06-11 ENCOUNTER — Ambulatory Visit (INDEPENDENT_AMBULATORY_CARE_PROVIDER_SITE_OTHER): Payer: Managed Care, Other (non HMO) | Admitting: Family Medicine

## 2020-06-11 VITALS — BP 111/77 | HR 67 | Temp 98.0°F

## 2020-06-11 DIAGNOSIS — I10 Essential (primary) hypertension: Secondary | ICD-10-CM

## 2020-06-11 DIAGNOSIS — Z6836 Body mass index (BMI) 36.0-36.9, adult: Secondary | ICD-10-CM | POA: Diagnosis not present

## 2020-06-12 NOTE — Progress Notes (Signed)
Chief Complaint:   OBESITY Danielle Mcdonald is here to discuss her progress with her obesity treatment plan along with follow-up of her obesity related diagnoses. Danielle Mcdonald is on the Category 2 Plan and keeping a food journal and adhering to recommended goals of 400-500 calories and 35+ grams of protein at supper daily and states she is following her eating plan approximately 40% of the time. Danielle Mcdonald states she is doing 0 minutes 0 times per week.  Today's visit was #: 4 Starting weight: 214 lbs Starting date: 05/01/2020 Today's weight: 207 lbs Today's date: 06/11/2020 Total lbs lost to date: 7 Total lbs lost since last in-office visit: 0  Interim History: Danielle Mcdonald has had company for 2 weeks and she has done more celebration eating. She notes her protein has decreased and she is working on increasing this.  Subjective:   1. Essential hypertension Danielle Mcdonald's blood pressure is well controlled. She denies chest pain or lightheadedness. She continues to do well with her diet prescription.  Assessment/Plan:   1. Essential hypertension Danielle Mcdonald will continue her medications, diet, exercise, and healthy weight loss to improve blood pressure control. She will watch for signs of hypotension as she continues her lifestyle modifications.  2. Class 2 severe obesity with serious comorbidity and body mass index (BMI) of 36.0 to 36.9 in adult, unspecified obesity type (HCC) Danielle Mcdonald is currently in the action stage of change. As such, her goal is to continue with weight loss efforts. She has agreed to the Category 2 Plan.   Behavioral modification strategies: increasing lean protein intake, dealing with family or coworker sabotage, holiday eating strategies  and celebration eating strategies.  Danielle Mcdonald has agreed to follow-up with our clinic in 2 weeks. She was informed of the importance of frequent follow-up visits to maximize her success with intensive lifestyle modifications for her multiple health  conditions.   Objective:   Blood pressure 111/77, pulse 67, temperature 98 F (36.7 C), temperature source Oral, SpO2 100 %. There is no height or weight on file to calculate BMI.  General: Cooperative, alert, well developed, in no acute distress. HEENT: Conjunctivae and lids unremarkable. Cardiovascular: Regular rhythm.  Lungs: Normal work of breathing. Neurologic: No focal deficits.   Lab Results  Component Value Date   CREATININE 0.80 05/17/2020   BUN 14 05/17/2020   NA 140 05/17/2020   K 4.5 05/17/2020   CL 104 05/17/2020   CO2 26 05/17/2020   Lab Results  Component Value Date   ALT 20 05/17/2020   AST 20 05/17/2020   ALKPHOS 65 05/17/2020   BILITOT 0.5 05/17/2020   Lab Results  Component Value Date   HGBA1C 5.3 05/17/2020   HGBA1C 5.2 05/01/2020   Lab Results  Component Value Date   INSULIN 8.9 05/17/2020   INSULIN CANCELED 05/01/2020   Lab Results  Component Value Date   TSH 1.550 05/17/2020   Lab Results  Component Value Date   CHOL 175 05/17/2020   HDL 39 (L) 05/17/2020   LDLCALC 118 (H) 05/17/2020   TRIG 95 05/17/2020   Lab Results  Component Value Date   WBC 7.5 05/01/2020   HGB 12.1 05/01/2020   HCT 36.2 05/01/2020   MCV 83 05/01/2020   PLT 286 05/01/2020   No results found for: IRON, TIBC, FERRITIN  Attestation Statements:   Reviewed by clinician on day of visit: allergies, medications, problem list, medical history, surgical history, family history, social history, and previous encounter notes.  Time spent on visit including  pre-visit chart review and post-visit care and charting was 31 minutes.    I, Burt Knack, am acting as transcriptionist for Quillian Quince, MD.  I have reviewed the above documentation for accuracy and completeness, and I agree with the above. -  Quillian Quince, MD

## 2020-06-26 ENCOUNTER — Encounter (INDEPENDENT_AMBULATORY_CARE_PROVIDER_SITE_OTHER): Payer: Self-pay | Admitting: Adult Health

## 2020-06-26 ENCOUNTER — Other Ambulatory Visit: Payer: Self-pay

## 2020-06-26 ENCOUNTER — Ambulatory Visit (INDEPENDENT_AMBULATORY_CARE_PROVIDER_SITE_OTHER): Payer: Managed Care, Other (non HMO) | Admitting: Adult Health

## 2020-06-26 VITALS — BP 122/82 | HR 79 | Temp 97.9°F | Ht 63.0 in | Wt 210.0 lb

## 2020-06-26 DIAGNOSIS — I10 Essential (primary) hypertension: Secondary | ICD-10-CM | POA: Diagnosis not present

## 2020-06-26 DIAGNOSIS — Z6837 Body mass index (BMI) 37.0-37.9, adult: Secondary | ICD-10-CM

## 2020-06-26 DIAGNOSIS — E7849 Other hyperlipidemia: Secondary | ICD-10-CM | POA: Diagnosis not present

## 2020-06-27 NOTE — Progress Notes (Signed)
Chief Complaint:   OBESITY Danielle Mcdonald is here to discuss her progress with her obesity treatment plan along with follow-up of her obesity related diagnoses. Danielle Mcdonald is on the Category 2 Plan and states she is following her eating plan approximately 60% of the time. Danielle Mcdonald states she is exercising 0 minutes 0 times per week.  Today's visit was #: 5 Starting weight: 214 lbs Starting date: 05/01/2020 Today's weight: 210 lbs Today's date: 06/26/2020 Total lbs lost to date: 4 Total lbs lost since last in-office visit: 0  Interim History: Danielle Mcdonald has had family visiting the last month - her sister, mother, and finally her daughter and grandchildren. She was challenged to eat on plan while hosting and states "I had way too many gummy bears." She is motivated to get back on track. Her ultimate goal is to lose down to ~175 lbs.  Subjective:   Essential hypertension. Blood pressure is at goal at today's office visit. Danielle Mcdonald is on lisinopril 10 mg daily. She denies acute cardiac symptoms.  BP Readings from Last 3 Encounters:  06/26/20 122/82  06/11/20 111/77  05/28/20 119/80   Lab Results  Component Value Date   CREATININE 0.80 05/17/2020   CREATININE 0.8 03/21/2020   Other hyperlipidemia. Danielle Mcdonald is on pravastatin 40 mg daily. She denies myalgias.   Lab Results  Component Value Date   CHOL 175 05/17/2020   HDL 39 (L) 05/17/2020   LDLCALC 118 (H) 05/17/2020   TRIG 95 05/17/2020   Lab Results  Component Value Date   ALT 20 05/17/2020   AST 20 05/17/2020   ALKPHOS 65 05/17/2020   BILITOT 0.5 05/17/2020   The 10-year ASCVD risk score Denman George DC Jr., et al., 2013) is: 13.4%   Values used to calculate the score:     Age: 64 years     Sex: Female     Is Non-Hispanic African American: Yes     Diabetic: No     Tobacco smoker: Yes     Systolic Blood Pressure: 122 mmHg     Is BP treated: Yes     HDL Cholesterol: 39 mg/dL     Total Cholesterol: 175  mg/dL  Assessment/Plan:   Essential hypertension. Tiziana is working on healthy weight loss and exercise to improve blood pressure control. We will watch for signs of hypotension as she continues her lifestyle modifications. She will continue ACE as directed and will continue to follow the Category 2 meal plan.  Other hyperlipidemia. Cardiovascular risk and specific lipid/LDL goals reviewed.  We discussed several lifestyle modifications today and Danielle Mcdonald will continue to work on diet, exercise and weight loss efforts. Orders and follow up as documented in patient record. She will continue her statin therapy as directed and will continue to follow the Category 2 meal plan.  Counseling Intensive lifestyle modifications are the first line treatment for this issue. . Dietary changes: Increase soluble fiber. Decrease simple carbohydrates. . Exercise changes: Moderate to vigorous-intensity aerobic activity 150 minutes per week if tolerated. . Lipid-lowering medications: see documented in medical record.  Class 2 severe obesity with serious comorbidity and body mass index (BMI) of 37.0 to 37.9 in adult, unspecified obesity type (HCC).  [name] is currently in the action stage of change. As such, her goal is to continue with weight loss efforts. She has agreed to the Category 2 Plan.   Handout was provided on Additional Breakfast Options.  Exercise goals: Danielle Mcdonald will increase walking as tolerated.  Behavioral modification strategies:  increasing lean protein intake, meal planning and cooking strategies, better snacking choices and planning for success.  Danielle Mcdonald has agreed to follow-up with our clinic in 2 weeks. She was informed of the importance of frequent follow-up visits to maximize her success with intensive lifestyle modifications for her multiple health conditions.   Objective:   Blood pressure 122/82, pulse 79, temperature 97.9 F (36.6 C), temperature source Oral, height 5\' 3"  (1.6 m),  weight 210 lb (95.3 kg), SpO2 99 %. Body mass index is 37.2 kg/m.  General: Cooperative, alert, well developed, in no acute distress. HEENT: Conjunctivae and lids unremarkable. Cardiovascular: Regular rhythm.  Lungs: Normal work of breathing. Neurologic: No focal deficits.   Lab Results  Component Value Date   CREATININE 0.80 05/17/2020   BUN 14 05/17/2020   NA 140 05/17/2020   K 4.5 05/17/2020   CL 104 05/17/2020   CO2 26 05/17/2020   Lab Results  Component Value Date   ALT 20 05/17/2020   AST 20 05/17/2020   ALKPHOS 65 05/17/2020   BILITOT 0.5 05/17/2020   Lab Results  Component Value Date   HGBA1C 5.3 05/17/2020   HGBA1C 5.2 05/01/2020   Lab Results  Component Value Date   INSULIN 8.9 05/17/2020   INSULIN CANCELED 05/01/2020   Lab Results  Component Value Date   TSH 1.550 05/17/2020   Lab Results  Component Value Date   CHOL 175 05/17/2020   HDL 39 (L) 05/17/2020   LDLCALC 118 (H) 05/17/2020   TRIG 95 05/17/2020   Lab Results  Component Value Date   WBC 7.5 05/01/2020   HGB 12.1 05/01/2020   HCT 36.2 05/01/2020   MCV 83 05/01/2020   PLT 286 05/01/2020   No results found for: IRON, TIBC, FERRITIN  Attestation Statements:   Reviewed by clinician on day of visit: allergies, medications, problem list, medical history, surgical history, family history, social history, and previous encounter notes.  Time spent on visit including pre-visit chart review and post-visit charting and care was 25 minutes.   I, 05/03/2020, am acting as Marianna Payment for Energy manager, NP-C   I have reviewed the above documentation for accuracy and completeness, and I agree with the above. -  [employer], NP

## 2020-06-28 DIAGNOSIS — E7849 Other hyperlipidemia: Secondary | ICD-10-CM | POA: Insufficient documentation

## 2020-06-28 DIAGNOSIS — I10 Essential (primary) hypertension: Secondary | ICD-10-CM | POA: Insufficient documentation

## 2020-07-10 ENCOUNTER — Ambulatory Visit (INDEPENDENT_AMBULATORY_CARE_PROVIDER_SITE_OTHER): Payer: Managed Care, Other (non HMO) | Admitting: Family Medicine

## 2020-07-10 ENCOUNTER — Ambulatory Visit (INDEPENDENT_AMBULATORY_CARE_PROVIDER_SITE_OTHER): Payer: Managed Care, Other (non HMO) | Admitting: Adult Health

## 2020-07-12 ENCOUNTER — Other Ambulatory Visit: Payer: Self-pay

## 2020-07-12 ENCOUNTER — Encounter (INDEPENDENT_AMBULATORY_CARE_PROVIDER_SITE_OTHER): Payer: Self-pay | Admitting: Family Medicine

## 2020-07-12 ENCOUNTER — Ambulatory Visit (INDEPENDENT_AMBULATORY_CARE_PROVIDER_SITE_OTHER): Payer: Managed Care, Other (non HMO) | Admitting: Family Medicine

## 2020-07-12 VITALS — BP 118/77 | HR 99 | Temp 97.9°F | Ht 63.0 in | Wt 208.0 lb

## 2020-07-12 DIAGNOSIS — I10 Essential (primary) hypertension: Secondary | ICD-10-CM | POA: Diagnosis not present

## 2020-07-12 DIAGNOSIS — Z6837 Body mass index (BMI) 37.0-37.9, adult: Secondary | ICD-10-CM

## 2020-07-17 NOTE — Progress Notes (Signed)
Chief Complaint:   OBESITY Danielle Mcdonald is here to discuss her progress with her obesity treatment plan along with follow-up of her obesity related diagnoses. Danielle Mcdonald is on the Category 2 Plan and states she is following her eating plan approximately 75% of the time. Danielle Mcdonald states she is doing 0 minutes 0 times per week.  Today's visit was #: 6 Starting weight: 214 lbs Starting date: 05/01/2020 Today's weight: 208 lbs Today's date: 07/12/2020 Total lbs lost to date: 6 Total lbs lost since last in-office visit: 2  Interim History: Danielle Mcdonald continues to do well with weight loss. She is still retaining some fluid but this has improved. Her hunger is controlled and she likes the freedom of additional breakfast options.  Subjective:   1. Essential hypertension Danielle Mcdonald's blood pressure continues to improve with diet and weight loss. She denies feeling lightheaded. She is pleasantly surprised at how much this has improved already.  Assessment/Plan:   1. Essential hypertension Danielle Mcdonald will continue her medications, diet, and healthy weight loss to improve blood pressure control. We will plan to recheck labs in 4-6 weeks. We will continue to monitor her blood pressure closely as she is at risk of hypotension with weight loss.  2. Class 2 severe obesity with serious comorbidity and body mass index (BMI) of 37.0 to 37.9 in adult, unspecified obesity type (HCC) Danielle Mcdonald is currently in the action stage of change. As such, her goal is to continue with weight loss efforts. She has agreed to the Category 2 Plan with breakfast and lunch options.   Behavioral modification strategies: increasing lean protein intake.  Danielle Mcdonald has agreed to follow-up with our clinic in 2 weeks. She was informed of the importance of frequent follow-up visits to maximize her success with intensive lifestyle modifications for her multiple health conditions.   Objective:   Blood pressure 118/77, pulse 99, temperature 97.9  F (36.6 C), height 5\' 3"  (1.6 m), weight 208 lb (94.3 kg), SpO2 99 %. Body mass index is 36.85 kg/m.  General: Cooperative, alert, well developed, in no acute distress. HEENT: Conjunctivae and lids unremarkable. Cardiovascular: Regular rhythm.  Lungs: Normal work of breathing. Neurologic: No focal deficits.   Lab Results  Component Value Date   CREATININE 0.80 05/17/2020   BUN 14 05/17/2020   NA 140 05/17/2020   K 4.5 05/17/2020   CL 104 05/17/2020   CO2 26 05/17/2020   Lab Results  Component Value Date   ALT 20 05/17/2020   AST 20 05/17/2020   ALKPHOS 65 05/17/2020   BILITOT 0.5 05/17/2020   Lab Results  Component Value Date   HGBA1C 5.3 05/17/2020   HGBA1C 5.2 05/01/2020   Lab Results  Component Value Date   INSULIN 8.9 05/17/2020   INSULIN CANCELED 05/01/2020   Lab Results  Component Value Date   TSH 1.550 05/17/2020   Lab Results  Component Value Date   CHOL 175 05/17/2020   HDL 39 (L) 05/17/2020   LDLCALC 118 (H) 05/17/2020   TRIG 95 05/17/2020   Lab Results  Component Value Date   WBC 7.5 05/01/2020   HGB 12.1 05/01/2020   HCT 36.2 05/01/2020   MCV 83 05/01/2020   PLT 286 05/01/2020   No results found for: IRON, TIBC, FERRITIN  Attestation Statements:   Reviewed by clinician on day of visit: allergies, medications, problem list, medical history, surgical history, family history, social history, and previous encounter notes.  Time spent on visit including pre-visit chart review and post-visit  care and charting was 22 minutes.    I, Burt Knack, am acting as transcriptionist for Quillian Quince, MD.  I have reviewed the above documentation for accuracy and completeness, and I agree with the above. -  Quillian Quince, MD

## 2020-07-30 ENCOUNTER — Other Ambulatory Visit: Payer: Self-pay

## 2020-07-30 ENCOUNTER — Ambulatory Visit (INDEPENDENT_AMBULATORY_CARE_PROVIDER_SITE_OTHER): Payer: Managed Care, Other (non HMO) | Admitting: Family Medicine

## 2020-07-30 ENCOUNTER — Encounter (INDEPENDENT_AMBULATORY_CARE_PROVIDER_SITE_OTHER): Payer: Self-pay | Admitting: Family Medicine

## 2020-07-30 VITALS — BP 121/80 | HR 60 | Temp 97.8°F | Ht 63.0 in | Wt 211.0 lb

## 2020-07-30 DIAGNOSIS — Z6837 Body mass index (BMI) 37.0-37.9, adult: Secondary | ICD-10-CM

## 2020-07-30 DIAGNOSIS — I10 Essential (primary) hypertension: Secondary | ICD-10-CM

## 2020-07-31 NOTE — Progress Notes (Signed)
Chief Complaint:   OBESITY Danielle Mcdonald is here to discuss her progress with her obesity treatment plan along with follow-up of her obesity related diagnoses. Danielle Mcdonald is on the Category 2 Plan with breakfast and lunch options and states she is following her eating plan approximately 25% of the time. Danielle Mcdonald states she is doing 0 minutes 0 times per week.  Today's visit was #: 7 Starting weight: 214 lbs Starting date: 05/01/2020 Today's weight: 211 lbs Today's date: 07/30/2020 Total lbs lost to date: 3 Total lbs lost since last in-office visit: 0  Interim History: Danielle Mcdonald has gotten off track with her eating plan, and notes increased snacking especially in the evenings. Her protein has likely decreased and her simple carbohydrates have increased. She is ready to get back on track with her Category 2 plan.  Subjective:   1. Essential hypertension Danielle Mcdonald's blood pressure is controlled. She is taking her lisinopril every other day due to feeling lightheaded on her own, and the lightheadedness has improved. She is also using her CPAP regularly.  Assessment/Plan:   1. Essential hypertension Danielle Mcdonald will continue working on healthy weight loss, diet, and exercise to improve blood pressure control. She will likely be able to discontinue lisinopril soon. Will continue to monitor, and will watch for signs of hypotension as she continues her lifestyle modifications.  2. Class 2 severe obesity with serious comorbidity and body mass index (BMI) of 37.0 to 37.9 in adult, unspecified obesity type (HCC) Danielle Mcdonald is currently in the action stage of change. As such, her goal is to continue with weight loss efforts. She has agreed to the Category 2 Plan.   Behavioral modification strategies: increasing lean protein intake, decreasing simple carbohydrates, meal planning and cooking strategies and emotional eating strategies.  Danielle Mcdonald has agreed to follow-up with our clinic in 2 weeks. She was informed of  the importance of frequent follow-up visits to maximize her success with intensive lifestyle modifications for her multiple health conditions.   Objective:   Blood pressure 121/80, pulse 60, temperature 97.8 F (36.6 C), height 5\' 3"  (1.6 m), weight 211 lb (95.7 kg), SpO2 100 %. Body mass index is 37.38 kg/m.  General: Cooperative, alert, well developed, in no acute distress. HEENT: Conjunctivae and lids unremarkable. Cardiovascular: Regular rhythm.  Lungs: Normal work of breathing. Neurologic: No focal deficits.   Lab Results  Component Value Date   CREATININE 0.80 05/17/2020   BUN 14 05/17/2020   NA 140 05/17/2020   K 4.5 05/17/2020   CL 104 05/17/2020   CO2 26 05/17/2020   Lab Results  Component Value Date   ALT 20 05/17/2020   AST 20 05/17/2020   ALKPHOS 65 05/17/2020   BILITOT 0.5 05/17/2020   Lab Results  Component Value Date   HGBA1C 5.3 05/17/2020   HGBA1C 5.2 05/01/2020   Lab Results  Component Value Date   INSULIN 8.9 05/17/2020   INSULIN CANCELED 05/01/2020   Lab Results  Component Value Date   TSH 1.550 05/17/2020   Lab Results  Component Value Date   CHOL 175 05/17/2020   HDL 39 (L) 05/17/2020   LDLCALC 118 (H) 05/17/2020   TRIG 95 05/17/2020   Lab Results  Component Value Date   WBC 7.5 05/01/2020   HGB 12.1 05/01/2020   HCT 36.2 05/01/2020   MCV 83 05/01/2020   PLT 286 05/01/2020   No results found for: IRON, TIBC, FERRITIN  Attestation Statements:   Reviewed by clinician on day of visit:  allergies, medications, problem list, medical history, surgical history, family history, social history, and previous encounter notes.  Time spent on visit including pre-visit chart review and post-visit care and charting was 31 minutes.    I, Trixie Dredge, am acting as transcriptionist for Dennard Nip, MD.  I have reviewed the above documentation for accuracy and completeness, and I agree with the above. -  Dennard Nip, MD

## 2020-08-14 ENCOUNTER — Encounter (INDEPENDENT_AMBULATORY_CARE_PROVIDER_SITE_OTHER): Payer: Self-pay | Admitting: Adult Health

## 2020-08-14 ENCOUNTER — Other Ambulatory Visit: Payer: Self-pay

## 2020-08-14 ENCOUNTER — Ambulatory Visit (INDEPENDENT_AMBULATORY_CARE_PROVIDER_SITE_OTHER): Payer: Managed Care, Other (non HMO) | Admitting: Adult Health

## 2020-08-14 VITALS — BP 130/78 | HR 60 | Temp 98.1°F | Ht 63.0 in | Wt 211.0 lb

## 2020-08-14 DIAGNOSIS — I10 Essential (primary) hypertension: Secondary | ICD-10-CM

## 2020-08-14 DIAGNOSIS — Z9989 Dependence on other enabling machines and devices: Secondary | ICD-10-CM | POA: Diagnosis not present

## 2020-08-14 DIAGNOSIS — E66812 Obesity, class 2: Secondary | ICD-10-CM

## 2020-08-14 DIAGNOSIS — G4733 Obstructive sleep apnea (adult) (pediatric): Secondary | ICD-10-CM

## 2020-08-14 DIAGNOSIS — Z6837 Body mass index (BMI) 37.0-37.9, adult: Secondary | ICD-10-CM

## 2020-08-14 NOTE — Progress Notes (Signed)
Chief Complaint:   OBESITY Rim Thatch is here to discuss her progress with her obesity treatment plan along with follow-up of her obesity related diagnoses. Ercilia is on the Category 2 Plan and states she is following her eating plan approximately 50% of the time. Lamira states she is exercising 0 minutes 0 times per week.  Today's visit was #: 8 Starting weight: 214 lbs Starting date: 05/01/2020 Today's weight: 211 lbs Today's date: 08/14/2020 Total lbs lost to date: 3 Total lbs lost since last in-office visit: 0  Interim History: Ane ate off plan over the weekend while assisting her  62 year old mother packing (she is moving in with one of her children) and enjoying some celebration eating. She has been researching colleges and financial aid sources for one of her grandchildren who will be attending college in the fall. She continues to crave sweets occasionally, last A1c and Insulin level in July 2021- stable.  Subjective:   Essential hypertension. Blood pressure and heart rate are at goal on today's office visit. Adiba is on lisinopril 10 mg daily and denies cough.  BP Readings from Last 3 Encounters:  08/14/20 130/78  07/30/20 121/80  07/12/20 118/77   Lab Results  Component Value Date   CREATININE 0.80 05/17/2020   CREATININE 0.8 03/21/2020   OSA on CPAP. CPAP was prescribed in 2017 - initial sleep study at [facility] in [place]. Florentina was seen by Neurology in May 2021 and recommended annual follow-up.  Assessment/Plan:   Essential hypertension. Emmanuel is working on healthy weight loss and exercise to improve blood pressure control. We will watch for signs of hypotension as she continues her lifestyle modifications. Adonna will continue lisinopril as directed and will consistently follow the Category 2 meal plan. Labs will be checked at her next office visit.  OSA on CPAP. Intensive lifestyle modifications are the first line treatment for this  issue. We discussed several lifestyle modifications today and she will continue to work on diet, exercise and weight loss efforts. We will continue to monitor. Orders and follow up as documented in patient record. Jumanah will continue nightly CPAP use and will follow-up with Neurology as directed.   Class 2 severe obesity with serious comorbidity and body mass index (BMI) of 37.0 to 37.9 in adult, unspecified obesity type (HCC).  Viona is currently in the action stage of change. As such, her goal is to continue with weight loss efforts. She has agreed to the Category 2 Plan. We recommend Clio bars when craving sweets.  Fasting labs will be checked at her next office visit.  Handout was provided on [employer].  Exercise goals: Manpreet will increase her walking daily.  Behavioral modification strategies: increasing lean protein intake, decreasing simple carbohydrates, no skipping meals, meal planning and cooking strategies, celebration eating strategies and planning for success.  Mittie has agreed to follow-up with our clinic fasting in 2 weeks. She was informed of the importance of frequent follow-up visits to maximize her success with intensive lifestyle modifications for her multiple health conditions.   Objective:   Blood pressure 130/78, pulse 60, temperature 98.1 F (36.7 C), height 5\' 3"  (1.6 m), weight 211 lb (95.7 kg), SpO2 100 %. Body mass index is 37.38 kg/m.  General: Cooperative, alert, well developed, in no acute distress. HEENT: Conjunctivae and lids unremarkable. Cardiovascular: Regular rhythm.  Lungs: Normal work of breathing. Neurologic: No focal deficits.   Lab Results  Component Value Date   CREATININE 0.80 05/17/2020  BUN 14 05/17/2020   NA 140 05/17/2020   K 4.5 05/17/2020   CL 104 05/17/2020   CO2 26 05/17/2020   Lab Results  Component Value Date   ALT 20 05/17/2020   AST 20 05/17/2020   ALKPHOS 65 05/17/2020   BILITOT 0.5 05/17/2020   Lab  Results  Component Value Date   HGBA1C 5.3 05/17/2020   HGBA1C 5.2 05/01/2020   Lab Results  Component Value Date   INSULIN 8.9 05/17/2020   INSULIN CANCELED 05/01/2020   Lab Results  Component Value Date   TSH 1.550 05/17/2020   Lab Results  Component Value Date   CHOL 175 05/17/2020   HDL 39 (L) 05/17/2020   LDLCALC 118 (H) 05/17/2020   TRIG 95 05/17/2020   Lab Results  Component Value Date   WBC 7.5 05/01/2020   HGB 12.1 05/01/2020   HCT 36.2 05/01/2020   MCV 83 05/01/2020   PLT 286 05/01/2020   No results found for: IRON, TIBC, FERRITIN  Attestation Statements:   Reviewed by clinician on day of visit: allergies, medications, problem list, medical history, surgical history, family history, social history, and previous encounter notes.  Time spent on visit including pre-visit chart review and post-visit charting and care was 27 minutes.   I, Marianna Payment, am acting as Energy manager for [employer], NP-C   I have reviewed the above documentation for accuracy and completeness, and I agree with the above. -  Adalaide Jaskolski d. Sahib Pella, NP-C

## 2020-08-29 ENCOUNTER — Ambulatory Visit (INDEPENDENT_AMBULATORY_CARE_PROVIDER_SITE_OTHER): Payer: Managed Care, Other (non HMO) | Admitting: Family Medicine

## 2020-08-29 ENCOUNTER — Other Ambulatory Visit: Payer: Self-pay

## 2020-08-29 ENCOUNTER — Encounter (INDEPENDENT_AMBULATORY_CARE_PROVIDER_SITE_OTHER): Payer: Self-pay | Admitting: Family Medicine

## 2020-08-29 VITALS — BP 118/77 | HR 55 | Temp 97.6°F | Ht 63.0 in | Wt 210.0 lb

## 2020-08-29 DIAGNOSIS — I10 Essential (primary) hypertension: Secondary | ICD-10-CM

## 2020-08-29 DIAGNOSIS — Z6837 Body mass index (BMI) 37.0-37.9, adult: Secondary | ICD-10-CM

## 2020-09-04 NOTE — Progress Notes (Signed)
Chief Complaint:   OBESITY Danielle Mcdonald is here to discuss her progress with her obesity treatment plan along with follow-up of her obesity related diagnoses. [name] is on the Category 2 Plan and states she is following her eating plan approximately 80% of the time. [name] states she is moving daily doing activities.  Today's visit was #: 9 Starting weight: 214 lbs Starting date: 05/01/2020 Today's weight: 210 lbs Today's date: 08/29/2020 Total lbs lost to date: 4 Total lbs lost since last in-office visit: 1  Interim History: [name] continues to do well with weight loss on her Category 2 plan. She often skips breakfast and lunch, and it is likely that her RMR has decreased because of this.  Subjective:   1. Essential hypertension [name]'s blood pressure is well controlled on lisinopril, and she denies signs of hypotension. She is doing well working on diet and weight loss.  Assessment/Plan:   1. Essential hypertension [name] is working on healthy weight loss, diet, and exercise to improve blood pressure control. We will watch for signs of hypotension as she continues her lifestyle modifications.  2. Class 2 severe obesity with serious comorbidity and body mass index (BMI) of 37.0 to 37.9 in adult, unspecified obesity type (HCC) [name] is currently in the action stage of change. As such, her goal is to continue with weight loss efforts. She has agreed to the Category 2 Plan.   Exercise goals: As is.  Behavioral modification strategies: increasing lean protein intake, no skipping meals and holiday eating strategies .  [name] has agreed to follow-up with our clinic in 2 to 3 weeks. She was informed of the importance of frequent follow-up visits to maximize her success with intensive lifestyle modifications for her multiple health conditions.   Objective:   Blood pressure 118/77, pulse (!) 55, temperature 97.6 F (36.4 C), height 5\' 3"  (1.6 m), weight 210 lb (95.3 kg), SpO2  96 %. Body mass index is 37.2 kg/m.  General: Cooperative, alert, well developed, in no acute distress. HEENT: Conjunctivae and lids unremarkable. Cardiovascular: Regular rhythm.  Lungs: Normal work of breathing. Neurologic: No focal deficits.   Lab Results  Component Value Date   CREATININE 0.80 05/17/2020   BUN 14 05/17/2020   NA 140 05/17/2020   K 4.5 05/17/2020   CL 104 05/17/2020   CO2 26 05/17/2020   Lab Results  Component Value Date   ALT 20 05/17/2020   AST 20 05/17/2020   ALKPHOS 65 05/17/2020   BILITOT 0.5 05/17/2020   Lab Results  Component Value Date   HGBA1C 5.3 05/17/2020   HGBA1C 5.2 05/01/2020   Lab Results  Component Value Date   INSULIN 8.9 05/17/2020   INSULIN CANCELED 05/01/2020   Lab Results  Component Value Date   TSH 1.550 05/17/2020   Lab Results  Component Value Date   CHOL 175 05/17/2020   HDL 39 (L) 05/17/2020   LDLCALC 118 (H) 05/17/2020   TRIG 95 05/17/2020   Lab Results  Component Value Date   WBC 7.5 05/01/2020   HGB 12.1 05/01/2020   HCT 36.2 05/01/2020   MCV 83 05/01/2020   PLT 286 05/01/2020   No results found for: IRON, TIBC, FERRITIN  Attestation Statements:   Reviewed by clinician on day of visit: allergies, medications, problem list, medical history, surgical history, family history, social history, and previous encounter notes.  Time spent on visit including pre-visit chart review and post-visit care and charting was 20 minutes.  I, Trixie Dredge, am acting as transcriptionist for Dennard Nip, MD.  I have reviewed the above documentation for accuracy and completeness, and I agree with the above. -  Dennard Nip, MD

## 2020-09-12 ENCOUNTER — Ambulatory Visit (INDEPENDENT_AMBULATORY_CARE_PROVIDER_SITE_OTHER): Payer: Managed Care, Other (non HMO) | Admitting: Family Medicine

## 2020-09-12 ENCOUNTER — Other Ambulatory Visit: Payer: Self-pay

## 2020-09-12 ENCOUNTER — Encounter (INDEPENDENT_AMBULATORY_CARE_PROVIDER_SITE_OTHER): Payer: Self-pay | Admitting: Family Medicine

## 2020-09-12 VITALS — BP 121/79 | HR 79 | Temp 97.4°F | Ht 63.0 in

## 2020-09-12 DIAGNOSIS — Z6837 Body mass index (BMI) 37.0-37.9, adult: Secondary | ICD-10-CM

## 2020-09-12 DIAGNOSIS — I10 Essential (primary) hypertension: Secondary | ICD-10-CM | POA: Diagnosis not present

## 2020-09-13 NOTE — Progress Notes (Signed)
Chief Complaint:   OBESITY Lovinia is here to discuss her progress with her obesity treatment plan along with follow-up of her obesity related diagnoses. Danielle Mcdonald is on the Category 2 Plan and states she is following her eating plan approximately 0% of the time. Danielle Mcdonald states she is walking for 30-40 minutes 2 times per week.  Today's visit was #: 10 Starting weight: 214 lbs Starting date: 05/01/2020 Today's weight: 211 lbs Today's date: 09/12/2020 Total lbs lost to date: 3 Total lbs lost since last in-office visit: 0  Interim History: Danielle Mcdonald had extra challenges with company and Halloween. She feels the next  Few weeks will be easier. She is willing to look at other options for eating plan.  Subjective:   1. Essential hypertension Danielle Mcdonald's blood pressure is controlled with her medications and diet. She denies headache or dizziness.  Assessment/Plan:   1. Essential hypertension Danielle Mcdonald is working on healthy weight loss, diet, and exercise to improve blood pressure control. She will watch for signs of hypotension, especially with starting on a low carbohydrate diet.  2. Class 2 severe obesity with serious comorbidity and body mass index (BMI) of 37.0 to 37.9 in adult, unspecified obesity type (HCC) Danielle Mcdonald is currently in the action stage of change. As such, her goal is to continue with weight loss efforts. She has agreed to change to following a lower carbohydrate, vegetable and lean protein rich diet plan.   Exercise goals: As is.  Behavioral modification strategies: increasing lean protein intake and emotional eating strategies.  Danielle Mcdonald has agreed to follow-up with our clinic in 2 to 3 weeks. She was informed of the importance of frequent follow-up visits to maximize her success with intensive lifestyle modifications for her multiple health conditions.   Objective:   Blood pressure 121/79, pulse 79, temperature (!) 97.4 F (36.3 C), height 5\' 3"  (1.6 m), SpO2 99  %. Body mass index is 37.2 kg/m.  General: Cooperative, alert, well developed, in no acute distress. HEENT: Conjunctivae and lids unremarkable. Cardiovascular: Regular rhythm.  Lungs: Normal work of breathing. Neurologic: No focal deficits.   Lab Results  Component Value Date   CREATININE 0.80 05/17/2020   BUN 14 05/17/2020   NA 140 05/17/2020   K 4.5 05/17/2020   CL 104 05/17/2020   CO2 26 05/17/2020   Lab Results  Component Value Date   ALT 20 05/17/2020   AST 20 05/17/2020   ALKPHOS 65 05/17/2020   BILITOT 0.5 05/17/2020   Lab Results  Component Value Date   HGBA1C 5.3 05/17/2020   HGBA1C 5.2 05/01/2020   Lab Results  Component Value Date   INSULIN 8.9 05/17/2020   INSULIN CANCELED 05/01/2020   Lab Results  Component Value Date   TSH 1.550 05/17/2020   Lab Results  Component Value Date   CHOL 175 05/17/2020   HDL 39 (L) 05/17/2020   LDLCALC 118 (H) 05/17/2020   TRIG 95 05/17/2020   Lab Results  Component Value Date   WBC 7.5 05/01/2020   HGB 12.1 05/01/2020   HCT 36.2 05/01/2020   MCV 83 05/01/2020   PLT 286 05/01/2020   No results found for: IRON, TIBC, FERRITIN  Attestation Statements:   Reviewed by clinician on day of visit: allergies, medications, problem list, medical history, surgical history, family history, social history, and previous encounter notes.  Time spent on visit including pre-visit chart review and post-visit care and charting was 32 minutes.    05/03/2020, am acting  as transcriptionist for Dennard Nip, MD.  I have reviewed the above documentation for accuracy and completeness, and I agree with the above. -  Dennard Nip, MD

## 2020-10-02 ENCOUNTER — Ambulatory Visit (INDEPENDENT_AMBULATORY_CARE_PROVIDER_SITE_OTHER): Payer: Managed Care, Other (non HMO) | Admitting: Family Medicine

## 2020-10-09 ENCOUNTER — Ambulatory Visit (INDEPENDENT_AMBULATORY_CARE_PROVIDER_SITE_OTHER): Payer: Managed Care, Other (non HMO) | Admitting: Family Medicine

## 2020-10-10 ENCOUNTER — Ambulatory Visit (INDEPENDENT_AMBULATORY_CARE_PROVIDER_SITE_OTHER): Payer: Managed Care, Other (non HMO) | Admitting: Family Medicine

## 2020-10-11 ENCOUNTER — Ambulatory Visit (INDEPENDENT_AMBULATORY_CARE_PROVIDER_SITE_OTHER): Payer: Managed Care, Other (non HMO) | Admitting: Family Medicine

## 2020-10-11 ENCOUNTER — Other Ambulatory Visit: Payer: Self-pay

## 2020-10-11 ENCOUNTER — Encounter (INDEPENDENT_AMBULATORY_CARE_PROVIDER_SITE_OTHER): Payer: Self-pay | Admitting: Family Medicine

## 2020-10-11 VITALS — BP 127/79 | HR 76 | Temp 97.8°F | Ht 63.0 in | Wt 212.0 lb

## 2020-10-11 DIAGNOSIS — Z6837 Body mass index (BMI) 37.0-37.9, adult: Secondary | ICD-10-CM | POA: Diagnosis not present

## 2020-10-11 DIAGNOSIS — I10 Essential (primary) hypertension: Secondary | ICD-10-CM

## 2020-10-16 NOTE — Progress Notes (Signed)
Chief Complaint:   OBESITY Danielle Mcdonald is here to discuss her progress with her obesity treatment plan along with follow-up of her obesity related diagnoses. Danielle Mcdonald is on following a lower carbohydrate, vegetable and lean protein rich diet plan and states she is following her eating plan approximately 0% of the time. Danielle Mcdonald states she is walking for 30 minutes 3 times per week.  Today's visit was #: 11 Starting weight: 214 lbs Starting date: 05/01/2020 Today's weight: 212 lbs Today's date: 10/11/2020 Total lbs lost to date: 2 Total lbs lost since last in-office visit: 0  Interim History: Danielle Mcdonald did more celebration eating and eating out while traveling. She notes she did less snacking however, and she is ready to get back on track.  Subjective:   1. Essential hypertension Danielle Mcdonald's blood pressure is controlled on her medications. She denies chest pain or lightheadedness.  Assessment/Plan:   1. Essential hypertension Danielle Mcdonald was encouraged to continue with diet, exercise, and weight loss, and will continue to monitor. We will watch for signs of hypotension as she continues her lifestyle modifications.  2. Class 2 severe obesity with serious comorbidity and body mass index (BMI) of 37.0 to 37.9 in adult, unspecified obesity type (HCC) Danielle Mcdonald is currently in the action stage of change. As such, her goal is to continue with weight loss efforts. She has agreed to the Category 2 Plan.   Exercise goals: As is.  Behavioral modification strategies: increasing water intake, no skipping meals, travel eating strategies and holiday eating strategies .  Danielle Mcdonald has agreed to follow-up with our clinic in 2 to 3 weeks. She was informed of the importance of frequent follow-up visits to maximize her success with intensive lifestyle modifications for her multiple health conditions.   Objective:   Blood pressure 127/79, pulse 76, temperature 97.8 F (36.6 C), height 5\' 3"  (1.6 m), weight 212  lb (96.2 kg), SpO2 98 %. Body mass index is 37.55 kg/m.  General: Cooperative, alert, well developed, in no acute distress. HEENT: Conjunctivae and lids unremarkable. Cardiovascular: Regular rhythm.  Lungs: Normal work of breathing. Neurologic: No focal deficits.   Lab Results  Component Value Date   CREATININE 0.80 05/17/2020   BUN 14 05/17/2020   NA 140 05/17/2020   K 4.5 05/17/2020   CL 104 05/17/2020   CO2 26 05/17/2020   Lab Results  Component Value Date   ALT 20 05/17/2020   AST 20 05/17/2020   ALKPHOS 65 05/17/2020   BILITOT 0.5 05/17/2020   Lab Results  Component Value Date   HGBA1C 5.3 05/17/2020   HGBA1C 5.2 05/01/2020   Lab Results  Component Value Date   INSULIN 8.9 05/17/2020   INSULIN CANCELED 05/01/2020   Lab Results  Component Value Date   TSH 1.550 05/17/2020   Lab Results  Component Value Date   CHOL 175 05/17/2020   HDL 39 (L) 05/17/2020   LDLCALC 118 (H) 05/17/2020   TRIG 95 05/17/2020   Lab Results  Component Value Date   WBC 7.5 05/01/2020   HGB 12.1 05/01/2020   HCT 36.2 05/01/2020   MCV 83 05/01/2020   PLT 286 05/01/2020   No results found for: IRON, TIBC, FERRITIN  Attestation Statements:   Reviewed by clinician on day of visit: allergies, medications, problem list, medical history, surgical history, family history, social history, and previous encounter notes.  Time spent on visit including pre-visit chart review and post-visit care and charting was 22 minutes.    05/03/2020  Daphine Deutscher, am acting as Energy manager for Quillian Quince, MD.  I have reviewed the above documentation for accuracy and completeness, and I agree with the above. -  Quillian Quince, MD

## 2020-10-29 ENCOUNTER — Other Ambulatory Visit: Payer: Self-pay

## 2020-10-29 ENCOUNTER — Ambulatory Visit (INDEPENDENT_AMBULATORY_CARE_PROVIDER_SITE_OTHER): Payer: Managed Care, Other (non HMO) | Admitting: Family Medicine

## 2020-10-29 ENCOUNTER — Encounter (INDEPENDENT_AMBULATORY_CARE_PROVIDER_SITE_OTHER): Payer: Self-pay | Admitting: Family Medicine

## 2020-10-29 VITALS — BP 142/87 | HR 85 | Temp 97.6°F | Ht 63.0 in | Wt 212.0 lb

## 2020-10-29 DIAGNOSIS — Z6837 Body mass index (BMI) 37.0-37.9, adult: Secondary | ICD-10-CM | POA: Diagnosis not present

## 2020-10-29 DIAGNOSIS — I10 Essential (primary) hypertension: Secondary | ICD-10-CM | POA: Diagnosis not present

## 2020-10-31 NOTE — Progress Notes (Signed)
Chief Complaint:   OBESITY Danielle Mcdonald is here to discuss her progress with her obesity treatment plan along with follow-up of her obesity related diagnoses. Danielle Mcdonald is on the Category 2 Plan and states she is following her eating plan approximately 0% of the time. Danielle Mcdonald states she is walking here and there for 30 minutes 2 times per week.  Today's visit was #: 12 Starting weight: 214 lbs Starting date: 05/01/2020 Today's weight: 212 lbs Today's date: 10/29/2020 Total lbs lost to date: 2 Total lbs lost since last in-office visit: 0  Interim History: Danielle Mcdonald has done well maintaining her weight since her last visit. She has had increased temptations and snacking. She is open to discussing Christmas strategies. She is doing well with portion control.  Subjective:   1. Essential hypertension [name]'s blood pressure is elevated today, normally well controlled. She has been taking lisinopril 10 mg every other day.  Assessment/Plan:   1. Essential hypertension [name] agreed to change lisinopril to 1/2 tablet PO q daily. She will continue working on healthy weight loss and exercise to improve blood pressure control. We will watch for signs of hypotension as she continues her lifestyle modifications.  2. Class 2 severe obesity with serious comorbidity and body mass index (BMI) of 37.0 to 37.9 in adult, unspecified obesity type (HCC) Danielle Mcdonald is currently in the action stage of change. As such, her goal is to continue with weight loss efforts. She has agreed to practicing portion control and making smarter food choices, such as increasing vegetables and decreasing simple carbohydrates.   Exercise goals: All adults should avoid inactivity. Some physical activity is better than none, and adults who participate in any amount of physical activity gain some health benefits.  Behavioral modification strategies: holiday eating strategies .  Danielle Mcdonald has agreed to follow-up with our clinic in 3  weeks. She was informed of the importance of frequent follow-up visits to maximize her success with intensive lifestyle modifications for her multiple health conditions.   Objective:   Blood pressure (!) 142/87, pulse 85, temperature 97.6 F (36.4 C), height 5\' 3"  (1.6 m), weight 212 lb (96.2 kg), SpO2 99 %. Body mass index is 37.55 kg/m.  General: Cooperative, alert, well developed, in no acute distress. HEENT: Conjunctivae and lids unremarkable. Cardiovascular: Regular rhythm.  Lungs: Normal work of breathing. Neurologic: No focal deficits.   Lab Results  Component Value Date   CREATININE 0.80 05/17/2020   BUN 14 05/17/2020   NA 140 05/17/2020   K 4.5 05/17/2020   CL 104 05/17/2020   CO2 26 05/17/2020   Lab Results  Component Value Date   ALT 20 05/17/2020   AST 20 05/17/2020   ALKPHOS 65 05/17/2020   BILITOT 0.5 05/17/2020   Lab Results  Component Value Date   HGBA1C 5.3 05/17/2020   HGBA1C 5.2 05/01/2020   Lab Results  Component Value Date   INSULIN 8.9 05/17/2020   INSULIN CANCELED 05/01/2020   Lab Results  Component Value Date   TSH 1.550 05/17/2020   Lab Results  Component Value Date   CHOL 175 05/17/2020   HDL 39 (L) 05/17/2020   LDLCALC 118 (H) 05/17/2020   TRIG 95 05/17/2020   Lab Results  Component Value Date   WBC 7.5 05/01/2020   HGB 12.1 05/01/2020   HCT 36.2 05/01/2020   MCV 83 05/01/2020   PLT 286 05/01/2020   No results found for: IRON, TIBC, FERRITIN  Attestation Statements:   Reviewed by clinician  on day of visit: allergies, medications, problem list, medical history, surgical history, family history, social history, and previous encounter notes.  Time spent on visit including pre-visit chart review and post-visit care and charting was 30 minutes.    I, Burt Knack, am acting as transcriptionist for Quillian Quince, MD.  I have reviewed the above documentation for accuracy and completeness, and I agree with the above. -

## 2020-11-07 ENCOUNTER — Other Ambulatory Visit: Payer: Self-pay | Admitting: Internal Medicine

## 2020-11-07 DIAGNOSIS — M79662 Pain in left lower leg: Secondary | ICD-10-CM

## 2020-11-07 DIAGNOSIS — M25562 Pain in left knee: Secondary | ICD-10-CM

## 2020-11-08 ENCOUNTER — Other Ambulatory Visit: Payer: Self-pay

## 2020-11-08 ENCOUNTER — Ambulatory Visit
Admission: RE | Admit: 2020-11-08 | Discharge: 2020-11-08 | Disposition: A | Discharge status: Home / Self Care | Payer: Managed Care, Other (non HMO) | Source: Ambulatory Visit | Attending: Gastroenterology | Admitting: Gastroenterology

## 2020-11-08 DIAGNOSIS — M79662 Pain in left lower leg: Secondary | ICD-10-CM

## 2020-11-08 DIAGNOSIS — M25562 Pain in left knee: Secondary | ICD-10-CM

## 2020-11-26 ENCOUNTER — Other Ambulatory Visit: Payer: Self-pay

## 2020-11-26 ENCOUNTER — Telehealth (INDEPENDENT_AMBULATORY_CARE_PROVIDER_SITE_OTHER): Payer: Managed Care, Other (non HMO) | Admitting: Family Medicine

## 2020-11-26 ENCOUNTER — Encounter (INDEPENDENT_AMBULATORY_CARE_PROVIDER_SITE_OTHER): Payer: Self-pay | Admitting: Family Medicine

## 2020-11-26 DIAGNOSIS — Z9189 Other specified personal risk factors, not elsewhere classified: Secondary | ICD-10-CM | POA: Diagnosis not present

## 2020-11-26 DIAGNOSIS — F3289 Other specified depressive episodes: Secondary | ICD-10-CM | POA: Diagnosis not present

## 2020-11-26 DIAGNOSIS — Z6837 Body mass index (BMI) 37.0-37.9, adult: Secondary | ICD-10-CM

## 2020-11-26 MED ORDER — BUPROPION HCL ER (SR) 150 MG PO TB12: 150.0000 mg | ORAL_TABLET | Freq: Every day | ORAL | 0 refills | Status: DC

## 2020-11-28 NOTE — Progress Notes (Signed)
TeleHealth Visit:  Due to the COVID-19 pandemic, this visit was completed with telemedicine (audio/video) technology to reduce patient and provider exposure as well as to preserve personal protective equipment.   Danielle Mcdonald has verbally consented to this TeleHealth visit. The patient is located at home, the provider is located at the [facility] office. The participants in this visit include the listed provider and patient. The visit was conducted today via MyChart.   Chief Complaint: OBESITY Danielle Mcdonald is here to discuss her progress with her obesity treatment plan along with follow-up of her obesity related diagnoses. Danielle Mcdonald is on practicing portion control and making smarter food choices, such as increasing vegetables and decreasing simple carbohydrates and states she is following her eating plan approximately 0% of the time. Danielle Mcdonald states she is doing 0 minutes 0 times per week.  Today's visit was #: 13 Starting weight: 214 lbs Starting date: 05/01/2020  Interim History: Danielle Mcdonald is struggling to stay on track with her weight loss efforts. She feels this is due to increased cravings versus increased hunger.  Subjective:   1. Other depression [name] notes struggling more with cravings and emotional eating behaviors. She is open to discussing medication options. She denies a history of seizures.  2. At risk for heart disease [name] is at a higher than average risk for cardiovascular disease due to obesity.   Assessment/Plan:   1. Other depression Behavior modification techniques were discussed today to help Danielle Mcdonald deal with her emotional/non-hunger eating behaviors. Danielle Mcdonald agreed to start bupropion SR 150 mg q AM with no refills. Orders and follow up as documented in patient record.   - buPROPion (WELLBUTRIN SR) 150 MG 12 hr tablet; Take 1 tablet (150 mg total) by mouth daily.  Dispense: 30 tablet; Refill: 0  2. At risk for heart disease Danielle Mcdonald was given  approximately 15 minutes of coronary artery disease prevention counseling today. She is 65 y.o. female and has risk factors for heart disease including obesity. We discussed intensive lifestyle modifications today with an emphasis on specific weight loss instructions and strategies.   Repetitive spaced learning was employed today to elicit superior memory formation and behavioral change.  3. Class 2 severe obesity with serious comorbidity and body mass index (BMI) of 37.0 to 37.9 in adult, unspecified obesity type (HCC) Danielle Mcdonald is currently in the action stage of change. As such, her goal is to continue with weight loss efforts. She has agreed to practicing portion control and making smarter food choices, such as increasing vegetables and decreasing simple carbohydrates.   Will plan to help decrease emotional eating behaviors after starting medications to help. Will follow up with additional strategies in 2 to 3 weeks.  Behavioral modification strategies: emotional eating strategies.  Danielle Mcdonald has agreed to follow-up with our clinic in 2 to 3 weeks. She was informed of the importance of frequent follow-up visits to maximize her success with intensive lifestyle modifications for her multiple health conditions.  Objective:   VITALS: Per patient if applicable, see vitals. GENERAL: Alert and in no acute distress. CARDIOPULMONARY: No increased WOB. Speaking in clear sentences.  PSYCH: Pleasant and cooperative. Speech normal rate and rhythm. Affect is appropriate. Insight and judgement are appropriate. Attention is focused, linear, and appropriate.  NEURO: Oriented as arrived to appointment on time with no prompting.   Lab Results  Component Value Date   CREATININE 0.80 05/17/2020   BUN 14 05/17/2020   NA 140 05/17/2020   K 4.5 05/17/2020   CL  104 05/17/2020   CO2 26 05/17/2020   Lab Results  Component Value Date   ALT 20 05/17/2020   AST 20 05/17/2020   ALKPHOS 65 05/17/2020   BILITOT  0.5 05/17/2020   Lab Results  Component Value Date   HGBA1C 5.3 05/17/2020   HGBA1C 5.2 05/01/2020   Lab Results  Component Value Date   INSULIN 8.9 05/17/2020   INSULIN CANCELED 05/01/2020   Lab Results  Component Value Date   TSH 1.550 05/17/2020   Lab Results  Component Value Date   CHOL 175 05/17/2020   HDL 39 (L) 05/17/2020   LDLCALC 118 (H) 05/17/2020   TRIG 95 05/17/2020   Lab Results  Component Value Date   WBC 7.5 05/01/2020   HGB 12.1 05/01/2020   HCT 36.2 05/01/2020   MCV 83 05/01/2020   PLT 286 05/01/2020   No results found for: IRON, TIBC, FERRITIN  Attestation Statements:   Reviewed by clinician on day of visit: allergies, medications, problem list, medical history, surgical history, family history, social history, and previous encounter notes.   I, Burt Knack, am acting as transcriptionist for Quillian Quince, MD.  I have reviewed the above documentation for accuracy and completeness, and I agree with the above. - Quillian Quince, MD

## 2020-12-21 ENCOUNTER — Other Ambulatory Visit (INDEPENDENT_AMBULATORY_CARE_PROVIDER_SITE_OTHER): Payer: Self-pay | Admitting: Family Medicine

## 2020-12-21 DIAGNOSIS — F3289 Other specified depressive episodes: Secondary | ICD-10-CM

## 2020-12-24 ENCOUNTER — Ambulatory Visit (INDEPENDENT_AMBULATORY_CARE_PROVIDER_SITE_OTHER): Payer: Managed Care, Other (non HMO) | Admitting: Family Medicine

## 2020-12-24 ENCOUNTER — Encounter (INDEPENDENT_AMBULATORY_CARE_PROVIDER_SITE_OTHER): Payer: Self-pay | Admitting: Family Medicine

## 2020-12-24 ENCOUNTER — Other Ambulatory Visit: Payer: Self-pay

## 2020-12-24 VITALS — BP 123/82 | HR 72 | Temp 98.0°F | Ht 63.0 in | Wt 218.0 lb

## 2020-12-24 DIAGNOSIS — E7849 Other hyperlipidemia: Secondary | ICD-10-CM | POA: Diagnosis not present

## 2020-12-24 DIAGNOSIS — E8881 Metabolic syndrome: Secondary | ICD-10-CM

## 2020-12-24 DIAGNOSIS — Z9189 Other specified personal risk factors, not elsewhere classified: Secondary | ICD-10-CM

## 2020-12-24 DIAGNOSIS — F3289 Other specified depressive episodes: Secondary | ICD-10-CM

## 2020-12-24 DIAGNOSIS — E559 Vitamin D deficiency, unspecified: Secondary | ICD-10-CM | POA: Diagnosis not present

## 2020-12-24 DIAGNOSIS — Z6838 Body mass index (BMI) 38.0-38.9, adult: Secondary | ICD-10-CM

## 2020-12-24 NOTE — Telephone Encounter (Signed)
Last OV with Dr. Beasley 

## 2020-12-26 NOTE — Progress Notes (Signed)
.     Chief Complaint:   OBESITY Danielle Mcdonald is here to discuss her progress with her obesity treatment plan along with follow-up of her obesity related diagnoses. Danielle Mcdonald is on practicing portion control and making smarter food choices, such as increasing vegetables and decreasing simple carbohydrates and states she is following her eating plan approximately 0% of the time. Danielle Mcdonald states she is doing 0 minutes 0 times per week.  Today's visit was #: 14 Starting weight: 214 lbs Starting date: 05/01/2020 Today's weight: 218 lbs Today's date: 12/24/2020 Total lbs lost to date: 0 Total lbs lost since last in-office visit: 0  Interim History: Danielle Mcdonald has been struggling to stay on track. She has increased stress and she notes increased snacking and comfort eating. She has been under a lot of stress and she hasn't been able to meal plan or prep as much recently.  Subjective:   1. Other hyperlipidemia Danielle Mcdonald is working on diet and weight loss, and she is due for labs.  2. Vitamin D deficiency Danielle Mcdonald is on Vit D, and she is due for labs.  3. Insulin resistance Danielle Mcdonald is working on decreasing simple carbohydrates and weight loss. She is due to have labs checked.  4. Other depression, with emotional eating Danielle Mcdonald took Wellbutrin a few days and felt jittery so she stopped. She continue to work on emotional eating behaviors.  5. At risk for diabetes mellitus Danielle Mcdonald is at higher than average risk for developing diabetes due to obesity.   Assessment/Plan:   1. Other hyperlipidemia Cardiovascular risk and specific lipid/LDL goals reviewed. We discussed several lifestyle modifications today. [name] will continue to work on diet, exercise and weight loss efforts. We will check labs today. Orders and follow up as documented in patient record.   - Lipid Panel With LDL/HDL Ratio  2. Vitamin D deficiency Low Vitamin D level contributes to fatigue and are associated with obesity, breast, and  colon cancer. We will check labs today. [name] will follow-up for routine testing of Vitamin D, at least 2-3 times per year to avoid over-replacement.  - VITAMIN D 25 Hydroxy (Vit-D Deficiency, Fractures)  3. Insulin resistance Danielle Mcdonald will continue to work on weight loss, exercise, and decreasing simple carbohydrates to help decrease the risk of diabetes. We will check labs today. Danielle Mcdonald agreed to follow-up with [place] as directed to closely monitor her progress.  - Comprehensive metabolic panel - Insulin, random - Hemoglobin A1c  4. Other depression, with emotional eating Behavior modification techniques were discussed today to help Danielle Mcdonald deal with her emotional/non-hunger eating behaviors. Danielle Mcdonald agreed to restart Wellbutrin and give it a few days as the jittery feeling frequently resolves in less than 1 week. Will continue to monitor. Orders and follow up as documented in patient record.   5. At risk for diabetes mellitus Danielle Mcdonald was given approximately 15 minutes of diabetes education and counseling today. We discussed intensive lifestyle modifications today with an emphasis on weight loss as well as increasing exercise and decreasing simple carbohydrates in her diet. We also reviewed medication options with an emphasis on risk versus benefit of those discussed.   Repetitive spaced learning was employed today to elicit superior memory formation and behavioral change.  6. Class 2 severe obesity with serious comorbidity and body mass index (BMI) of 38.0 to 38.9 in adult, unspecified obesity type (HCC) Danielle Mcdonald is currently in the action stage of change. As such, her goal is to continue with weight loss efforts. She has agreed to practicing portion  control and making smarter food choices, such as increasing vegetables and decreasing simple carbohydrates.   Behavioral modification strategies: emotional eating strategies and dealing with family or coworker sabotage.  Danielle Mcdonald has agreed to  follow-up with our clinic in 4 weeks. She was informed of the importance of frequent follow-up visits to maximize her success with intensive lifestyle modifications for her multiple health conditions.   Danielle Mcdonald was informed we would discuss her lab results at her next visit unless there is a critical issue that needs to be addressed sooner. Danielle Mcdonald agreed to keep her next visit at the agreed upon time to discuss these results.  Objective:   Blood pressure 123/82, pulse 72, temperature 98 F (36.7 C), height 5\' 3"  (1.6 m), weight 218 lb (98.9 kg), SpO2 98 %. Body mass index is 38.62 kg/m.  General: Cooperative, alert, well developed, in no acute distress. HEENT: Conjunctivae and lids unremarkable. Cardiovascular: Regular rhythm.  Lungs: Normal work of breathing. Neurologic: No focal deficits.   Lab Results  Component Value Date   CREATININE 0.80 05/17/2020   BUN 14 05/17/2020   NA 140 05/17/2020   K 4.5 05/17/2020   CL 104 05/17/2020   CO2 26 05/17/2020   Lab Results  Component Value Date   ALT 20 05/17/2020   AST 20 05/17/2020   ALKPHOS 65 05/17/2020   BILITOT 0.5 05/17/2020   Lab Results  Component Value Date   HGBA1C 5.3 05/17/2020   HGBA1C 5.2 05/01/2020   Lab Results  Component Value Date   INSULIN 8.9 05/17/2020   INSULIN CANCELED 05/01/2020   Lab Results  Component Value Date   TSH 1.550 05/17/2020   Lab Results  Component Value Date   CHOL 175 05/17/2020   HDL 39 (L) 05/17/2020   LDLCALC 118 (H) 05/17/2020   TRIG 95 05/17/2020   Lab Results  Component Value Date   WBC 7.5 05/01/2020   HGB 12.1 05/01/2020   HCT 36.2 05/01/2020   MCV 83 05/01/2020   PLT 286 05/01/2020   No results found for: IRON, TIBC, FERRITIN  Attestation Statements:   Reviewed by clinician on day of visit: allergies, medications, problem list, medical history, surgical history, family history, social history, and previous encounter notes.   I, 05/03/2020, am acting as  transcriptionist for Burt Knack, MD.  I have reviewed the above documentation for accuracy and completeness, and I agree with the above. -  Quillian Quince, MD

## 2021-01-16 ENCOUNTER — Other Ambulatory Visit: Payer: Self-pay

## 2021-01-16 ENCOUNTER — Ambulatory Visit (INDEPENDENT_AMBULATORY_CARE_PROVIDER_SITE_OTHER): Payer: Managed Care, Other (non HMO)

## 2021-01-16 ENCOUNTER — Ambulatory Visit (INDEPENDENT_AMBULATORY_CARE_PROVIDER_SITE_OTHER): Payer: Managed Care, Other (non HMO) | Admitting: Pulmonary Disease

## 2021-01-16 ENCOUNTER — Encounter: Payer: Self-pay | Admitting: Pulmonary Disease

## 2021-01-16 VITALS — BP 124/80 | HR 67 | Temp 98.0°F | Ht 64.0 in | Wt 223.0 lb

## 2021-01-16 DIAGNOSIS — R0602 Shortness of breath: Secondary | ICD-10-CM

## 2021-01-16 NOTE — Patient Instructions (Signed)
Shortness of breath  Graded exercise as tolerated  Obtain a chest x-ray today Schedule for pulmonary function test  Follow-up in 4 to 6 weeks  Inspiratory muscle trainers-look them up and find more information, they do help with respiratory muscle training  Call with significant concerns

## 2021-01-16 NOTE — Progress Notes (Signed)
Danielle Mcdonald    [id number]    1956/10/10  Primary Care Physician:Prevost, Gaylyn Lambert, FNP  Referring Physician: Fatima Sanger, FNP [address] [place],  [place] [address]  Chief complaint:   Patient with shortness of breath  HPI:  Shortness of breath with activity Shortness of breath with conversation Sometimes when she takes in a deep breath and is trying to exhale, feels she is not able to do this optimally  No chest pains or chest discomfort No history of respiratory problems  She does have a history of smoking, currently smokes about 3 cigarettes to 4 cigarettes a week Previously was smoking about a third of a pack a day  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 Bartholomew Crews with CPAP and well-controlled Usually gets about 8 hours of sleep every night and feels rested  Has a history of hypertension, hypercholesterolemia: History of knee pain and joint discomfort   Outpatient Encounter Medications as of 01/16/2021  Medication Sig  . acetaminophen (TYLENOL) 500 MG tablet Take 500 mg by mouth every 6 (six) hours as needed.  . Cholecalciferol (VITAMIN D3 PO) Take 1,000 Units by mouth daily.  Marland Kitchen lisinopril (ZESTRIL) 10 MG tablet Take 5 mg by mouth daily. Take 1/2 tablet by mouth once daily.  . Multiple Vitamins-Minerals (WOMENS MULTIVITAMIN PO) Take by mouth.  . pantoprazole (PROTONIX) 40 MG tablet Take 40 mg by mouth daily.  . pravastatin (PRAVACHOL) 40 MG tablet pravastatin 40 mg tablet  . buPROPion (WELLBUTRIN SR) 150 MG 12 hr tablet Take 1 tablet (150 mg total) by mouth daily. (Patient not taking: Reported on 01/16/2021)  . cyclobenzaprine (FLEXERIL) 5 MG tablet Take 5 mg by mouth daily as needed. (Patient not taking: Reported on 01/16/2021)  . ibuprofen (ADVIL) 400 MG tablet Take 400 mg by mouth every 6 (six) hours as needed. (Patient not taking: Reported on  01/16/2021)   No facility-administered encounter medications on file as of 01/16/2021.    Allergies as of 01/16/2021 - Review Complete 01/16/2021  Allergen Reaction Noted  . Sulfa antibiotics  03/12/2020    Past Medical History:  Diagnosis Date  . Asthma   . Digestive disorder   . GERD (gastroesophageal reflux disease)   . High cholesterol   . Hypertension   . Joint pain   . Lactose intolerance   . Sleep apnea     Past Surgical History:  Procedure Laterality Date  . TONSILLECTOMY    . TUBAL LIGATION      Family History  Problem Relation Age of Onset  . Thyroid disease Mother   . Diabetes Mother   . Osteoarthritis Mother   . Hypertension Mother   . Heart disease Mother   . Obesity Mother   . Lung cancer Father   . Alcoholism Father     Social History   Socioeconomic History  . Marital status: Married    Spouse name: Melvinia Ashby   . Number of children: 1  . Years of education: Not on file  . Highest education level: Not on file  Occupational History  . Occupation: Retired  Tobacco Use  . Smoking status: Current Some Day Smoker    Types: Cigarettes    Last attempt to quit: 11/11/2019    Years since quitting: 1.1  . Smokeless tobacco: Never Used  . Tobacco comment: 4-5 cigarettes a week 01/16/2021  Vaping Use  .  Vaping Use: Never used  Substance and Sexual Activity  . Alcohol use: Not Currently    Comment: rare   . Drug use: Never  . Sexual activity: Not on file  Other Topics Concern  . Not on file  Social History Narrative  . Not on file   Social Determinants of Health   Financial Resource Strain: Not on file  Food Insecurity: Not on file  Transportation Needs: Not on file  Physical Activity: Not on file  Stress: Not on file  Social Connections: Not on file  Intimate Partner Violence: Not on file    Review of Systems  Constitutional: Negative for fatigue.  Respiratory: Positive for apnea and shortness of breath. Negative for cough.      Vitals:   01/16/21 1007  BP: 124/80  Pulse: 67  Temp: 98 F (36.7 C)  SpO2: 100%     Physical Exam Constitutional:      Appearance: She is obese.  HENT:     Head: Normocephalic.     Mouth/Throat:     Mouth: Mucous membranes are moist.  Eyes:     General:        Right eye: No discharge.        Left eye: No discharge.  Cardiovascular:     Rate and Rhythm: Normal rate and regular rhythm.     Heart sounds: No murmur heard. No friction rub.  Pulmonary:     Effort: No respiratory distress.     Breath sounds: No stridor. No wheezing or rhonchi.  Musculoskeletal:     Cervical back: No rigidity or tenderness.  Neurological:     Mental Status: She is alert.  Psychiatric:        Mood and Affect: Mood normal.    Data Reviewed: Recent blood work 05/17/2020-within normal limits Normal TFT  Assessment:  Shortness of breath -May be partially related to deconditioning - Mostly occurs at rest although does not exercise on a regular basis -Does get short of breath with activity as well  Obstructive sleep apnea -Follows up with Dr. Vickey Huger -Feels well controlled  Obesity  Plan/Recommendations: Obtain a chest x-ray  Schedule for pulmonary function test  Graded exercises as tolerated-importance of exercises discussed extensively  I did encourage her to research inspiratory muscle training to see if this helps  Tentative follow-up in 4 to 6 weeks   Virl Diamond MD [facility] 01/16/2021, 10:34 AM  CC: Fatima Sanger, FNP

## 2021-01-21 ENCOUNTER — Ambulatory Visit (INDEPENDENT_AMBULATORY_CARE_PROVIDER_SITE_OTHER): Payer: Managed Care, Other (non HMO) | Admitting: Family Medicine

## 2021-03-04 ENCOUNTER — Other Ambulatory Visit: Payer: Self-pay | Admitting: Registered Nurse

## 2021-03-04 DIAGNOSIS — Z1231 Encounter for screening mammogram for malignant neoplasm of breast: Secondary | ICD-10-CM

## 2021-03-14 ENCOUNTER — Ambulatory Visit: Payer: Managed Care, Other (non HMO) | Admitting: Family Medicine

## 2021-04-01 ENCOUNTER — Other Ambulatory Visit: Payer: Self-pay | Admitting: Internal Medicine

## 2021-04-01 DIAGNOSIS — E7800 Pure hypercholesterolemia, unspecified: Secondary | ICD-10-CM

## 2021-04-02 ENCOUNTER — Other Ambulatory Visit: Payer: Self-pay | Admitting: Internal Medicine

## 2021-04-02 DIAGNOSIS — F17200 Nicotine dependence, unspecified, uncomplicated: Secondary | ICD-10-CM

## 2021-04-22 ENCOUNTER — Other Ambulatory Visit: Payer: Self-pay

## 2021-04-22 ENCOUNTER — Ambulatory Visit
Admission: RE | Admit: 2021-04-22 | Discharge: 2021-04-22 | Disposition: A | Discharge status: Home / Self Care | Payer: Managed Care, Other (non HMO) | Source: Ambulatory Visit | Attending: Registered Nurse | Admitting: Registered Nurse

## 2021-04-22 DIAGNOSIS — Z1231 Encounter for screening mammogram for malignant neoplasm of breast: Secondary | ICD-10-CM

## 2021-04-24 ENCOUNTER — Ambulatory Visit
Admission: RE | Admit: 2021-04-24 | Discharge: 2021-04-24 | Disposition: A | Discharge status: Home / Self Care | Payer: Managed Care, Other (non HMO) | Source: Ambulatory Visit | Attending: Internal Medicine | Admitting: Internal Medicine

## 2021-04-24 ENCOUNTER — Ambulatory Visit
Admission: RE | Admit: 2021-04-24 | Discharge: 2021-04-24 | Disposition: A | Discharge status: Home / Self Care | Payer: No Typology Code available for payment source | Source: Ambulatory Visit | Attending: Internal Medicine | Admitting: Internal Medicine

## 2021-04-24 DIAGNOSIS — F17200 Nicotine dependence, unspecified, uncomplicated: Secondary | ICD-10-CM

## 2021-04-26 ENCOUNTER — Other Ambulatory Visit (HOSPITAL_COMMUNITY)
Admission: RE | Admit: 2021-04-26 | Discharge: 2021-04-26 | Disposition: A | Discharge status: Home / Self Care | Payer: Managed Care, Other (non HMO) | Source: Ambulatory Visit | Attending: Pulmonary Disease | Admitting: Pulmonary Disease

## 2021-04-26 ENCOUNTER — Ambulatory Visit: Payer: Managed Care, Other (non HMO)

## 2021-04-26 DIAGNOSIS — Z01812 Encounter for preprocedural laboratory examination: Secondary | ICD-10-CM | POA: Diagnosis not present

## 2021-04-26 DIAGNOSIS — Z20822 Contact with and (suspected) exposure to covid-19: Secondary | ICD-10-CM | POA: Diagnosis not present

## 2021-04-26 LAB — SARS CORONAVIRUS 2 (TAT 6-24 HRS): SARS Coronavirus 2: NEGATIVE

## 2021-04-29 ENCOUNTER — Ambulatory Visit (INDEPENDENT_AMBULATORY_CARE_PROVIDER_SITE_OTHER): Payer: Managed Care, Other (non HMO) | Admitting: Pulmonary Disease

## 2021-04-29 ENCOUNTER — Encounter: Payer: Self-pay | Admitting: Pulmonary Disease

## 2021-04-29 ENCOUNTER — Other Ambulatory Visit: Payer: Self-pay

## 2021-04-29 VITALS — BP 142/82 | HR 68 | Temp 98.0°F | Ht 63.0 in | Wt 220.2 lb

## 2021-04-29 DIAGNOSIS — R0602 Shortness of breath: Secondary | ICD-10-CM | POA: Diagnosis not present

## 2021-04-29 LAB — PULMONARY FUNCTION TEST
DL/VA % pred: 110 %
DL/VA: 4.68 ml/min/mmHg/L
DLCO cor % pred: 104 %
DLCO cor: 20.34 ml/min/mmHg
DLCO unc % pred: 104 %
DLCO unc: 20.34 ml/min/mmHg
FEF 25-75 Post: 2.13 L/sec
FEF 25-75 Pre: 2.11 L/sec
FEF2575-%Change-Post: 0 %
FEF2575-%Pred-Post: 110 %
FEF2575-%Pred-Pre: 109 %
FEV1-%Change-Post: 0 %
FEV1-%Pred-Post: 103 %
FEV1-%Pred-Pre: 103 %
FEV1-Post: 2.03 L
FEV1-Pre: 2.02 L
FEV1FVC-%Change-Post: 3 %
FEV1FVC-%Pred-Pre: 104 %
FEV6-%Change-Post: -2 %
FEV6-%Pred-Post: 98 %
FEV6-%Pred-Pre: 101 %
FEV6-Post: 2.37 L
FEV6-Pre: 2.44 L
FEV6FVC-%Change-Post: 0 %
FEV6FVC-%Pred-Post: 103 %
FEV6FVC-%Pred-Pre: 103 %
FVC-%Change-Post: -2 %
FVC-%Pred-Post: 95 %
FVC-%Pred-Pre: 97 %
FVC-Post: 2.37 L
FVC-Pre: 2.44 L
Post FEV1/FVC ratio: 86 %
Post FEV6/FVC ratio: 100 %
Pre FEV1/FVC ratio: 83 %
Pre FEV6/FVC Ratio: 100 %
RV % pred: 94 %
RV: 1.87 L
TLC % pred: 105 %
TLC: 5.19 L

## 2021-04-29 NOTE — Progress Notes (Signed)
Full PFT completed today ? ?

## 2021-04-29 NOTE — Patient Instructions (Signed)
Normal breathing study  Mild emphysema on your CT  Continue graded exercises as tolerated Call with any significant concerns  I will see you back in a year

## 2021-04-29 NOTE — Progress Notes (Signed)
Danielle Mcdonald    [id number]    Apr 20, 1956  Primary Care Physician:Mcdonald, Danielle Lambert, FNP  Referring Physician: Fatima Sanger, FNP 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 Bartholomew Mcdonald with CPAP and well-controlled Usually gets about 8 hours of sleep every night and feels rested  Has a history of hypertension, hypercholesterolemia: History of knee pain and joint discomfort   Outpatient Encounter Medications as of 04/29/2021  Medication Sig   acetaminophen (TYLENOL) 500 MG tablet Take 500 mg by mouth every 6 (six) hours as needed.   buPROPion (WELLBUTRIN SR) 150 MG 12 hr tablet Take 1 tablet (150 mg total) by mouth daily.   Cholecalciferol (VITAMIN D3 PO) Take 1,000 Units by mouth daily.   cyclobenzaprine (FLEXERIL) 5 MG tablet Take 5 mg by mouth daily as needed.   ibuprofen (ADVIL) 400 MG tablet Take 400 mg by mouth every 6 (six) hours as needed.   lisinopril (ZESTRIL) 10 MG tablet Take 5 mg by mouth daily. Take 1/2 tablet by mouth once daily.   Multiple Vitamins-Minerals (WOMENS MULTIVITAMIN PO) Take by mouth.   pantoprazole (PROTONIX) 40 MG tablet Take 40 mg by mouth daily.   pravastatin (PRAVACHOL) 40 MG tablet pravastatin 40 mg tablet   No facility-administered encounter medications on file as of 04/29/2021.     Allergies as of 04/29/2021 - Review Complete 04/29/2021  Allergen Reaction Noted   Sulfa antibiotics  03/12/2020    Past Medical History:  Diagnosis Date   Asthma    Digestive disorder    GERD (gastroesophageal reflux disease)    High cholesterol    Hypertension    Joint pain    Lactose intolerance    Sleep apnea     Past Surgical History:  Procedure Laterality Date   TONSILLECTOMY     TUBAL LIGATION      Family History  Problem Relation Age of Onset   Thyroid disease Mother    Diabetes Mother    Osteoarthritis Mother    Hypertension Mother    Heart disease Mother    Obesity Mother    Lung cancer Father    Alcoholism Father     Social History   Socioeconomic History   Marital status: Married    Spouse name: Danielle Mcdonald    Number of children: 1   Years of education: Not on file   Highest education level: Not on file  Occupational History   Occupation: Retired  Tobacco Use   Smoking status: Some Days    Pack years: 0.00    Types: Cigarettes    Last attempt to quit: 11/11/2019    Years since quitting: 1.4   Smokeless tobacco: Never   Tobacco comments:    4-5 cigarettes a week 01/16/2021  Vaping Use   Vaping Use:  Never used  Substance and Sexual Activity   Alcohol use: Not Currently    Comment: rare    Drug use: Never   Sexual activity: Not on file  Other Topics Concern   Not on file  Social History Narrative   Not on file   Social Determinants of Health   Financial Resource Strain: Not on file  Food Insecurity: Not on file  Transportation Needs: Not on file  Physical Activity: Not on file  Stress: Not on file  Social Connections: Not on file  Intimate Partner Violence: Not on file    Review of Systems  Constitutional:  Negative for fatigue.  Respiratory:  Positive for apnea. Negative for cough and shortness of breath.    There were no vitals filed for this visit.    Physical Exam Constitutional:      Appearance: She is obese.   HENT:     Head: Normocephalic.     Mouth/Throat:     Mouth: Mucous membranes are moist.  Eyes:     General:        Right eye: No discharge.        Left eye: No discharge.  Cardiovascular:     Rate and Rhythm: Normal rate and regular rhythm.     Heart sounds: No murmur heard.   No friction rub.  Pulmonary:     Effort: No respiratory distress.     Breath sounds: No stridor. No wheezing or rhonchi.  Musculoskeletal:     Cervical back: No rigidity or tenderness.  Neurological:     Mental Status: She is alert.  Psychiatric:        Mood and Affect: Mood normal.   Data Reviewed: Recent blood work 05/17/2020-within normal limits Normal TFT  PFT today reviewed with the patient showing normal PFT  CT lung cancer screening reviewed showing mild emphysema  Assessment:  Shortness of breath -Likely related to some deconditioning -Mild emphysema on CT -Importance of staying off cigarettes discussed with the patient  Breathing is better -Graded exercises as tolerated  Mild emphysema on CT -No inhalers needed at present -Importance of staying off cigarettes  Obstructive sleep apnea -Follows up with Dr. Vickey Mcdonald -Feels well controlled  Obesity  Plan/Recommendations:  Graded exercise as tolerated  Follow-up in a year  Encouraged to call with any significant concerns  Virl Diamond MD [facility] 04/29/2021, 11:44 AM  CC: Danielle Sanger, FNP

## 2021-07-26 ENCOUNTER — Other Ambulatory Visit: Payer: Self-pay | Admitting: Orthopedic Surgery

## 2021-07-26 DIAGNOSIS — M542 Cervicalgia: Secondary | ICD-10-CM

## 2021-08-04 ENCOUNTER — Other Ambulatory Visit: Payer: Self-pay

## 2021-08-04 ENCOUNTER — Ambulatory Visit
Admission: RE | Admit: 2021-08-04 | Discharge: 2021-08-04 | Disposition: A | Discharge status: Home / Self Care | Payer: Managed Care, Other (non HMO) | Source: Ambulatory Visit | Attending: Orthopedic Surgery | Admitting: Orthopedic Surgery

## 2021-08-04 DIAGNOSIS — M542 Cervicalgia: Secondary | ICD-10-CM

## 2022-01-27 ENCOUNTER — Encounter: Payer: Self-pay | Admitting: Gastroenterology

## 2022-02-12 ENCOUNTER — Ambulatory Visit (INDEPENDENT_AMBULATORY_CARE_PROVIDER_SITE_OTHER): Payer: Managed Care, Other (non HMO) | Admitting: Gastroenterology

## 2022-02-12 ENCOUNTER — Other Ambulatory Visit: Payer: Managed Care, Other (non HMO)

## 2022-02-12 ENCOUNTER — Encounter: Payer: Self-pay | Admitting: Gastroenterology

## 2022-02-12 VITALS — BP 132/82 | HR 83 | Ht 63.0 in | Wt 225.2 lb

## 2022-02-12 DIAGNOSIS — R1084 Generalized abdominal pain: Secondary | ICD-10-CM | POA: Diagnosis not present

## 2022-02-12 DIAGNOSIS — K219 Gastro-esophageal reflux disease without esophagitis: Secondary | ICD-10-CM | POA: Diagnosis not present

## 2022-02-12 NOTE — Progress Notes (Signed)
? ?Referring Provider: No ref. provider found ?Primary Care Physician:  Fatima Sanger, FNP ? ?Reason for Consultation:  Upper abdominal pain ? ? ?IMPRESSION:  ?Postprandial upper abdominal pain, gas, and bloating ?Intermittent nocturnal reflux ?Fatty liver on ultrasound ? ? ?She would prefer to defer treatments until an evaluation is performed.  ? ? ?PLAN: ?- EGD with gastric biopsies for H pylori and duodenal biopsies for celiac ?- Alpha gal testing ?- Consider dicyclomine for symptomatic relief if she changes her mind ?- Obtain prior office visits, colonoscopy, and path from prior colonoscopies x 2  ?- May be due surveillance colonoscopy testing ?- Consider testing for symptomatic gallbladder disease if EGD is nondiagnostic ? ? ? ?HPI: Danielle Mcdonald is a 66 y.o. female referred by NP Larose Hires. She has a history of hypercholesterolemia, hypertension, obesity, inflammatory arthritis, asthma, and sleep apnea.  ? ?Presents today for evaluation of upper abdominal pain that develops while she's eating over the last 3-4 years. At times only one bite will trigger her pain and she will hold her abdomen for support. Associated bloating, eructation, and bloating. Some nocturnal reflux. Weight is stable. Appetite is good. Described as a cutting pain that she feels until the food passes. Loose stools recently. No history of constipation. No recent change in symptoms. ? ?Ginger chews after meals provide some relief. No other identified exacerbating or relieving features except she has been eating more [place] and avoiding red meat.  ? ?She uses ibuprofen more heavily in the past for a torn rotator cuff. She tries to use Tylenol although it doesn't work as well.  ? ?Saw Dr. Ewing Schlein in 2020 for the same symptoms. She had an ultrasound 03/27/20 that showed an echogenic liver.  ? ?A trial of pantoprazole caused arthralgias after several weeks.  ? ?Two prior colonoscopy, most recent Colonoscopy with Dr. Ewing Schlein 4-5 years ago. She  had polyps.  ? ?There is no known family history of colon cancer or polyps. No family history of stomach cancer or other GI malignancy. No family history of inflammatory bowel disease or celiac.  ? ? ?Past Medical History:  ?Diagnosis Date  ? Asthma   ? Digestive disorder   ? GERD (gastroesophageal reflux disease)   ? High cholesterol   ? Hypertension   ? Joint pain   ? Lactose intolerance   ? Sleep apnea   ? ? ?Past Surgical History:  ?Procedure Laterality Date  ? TONSILLECTOMY    ? TUBAL LIGATION    ? ?Current Outpatient Medications  ?Medication Sig Dispense Refill  ? acetaminophen (TYLENOL) 500 MG tablet Take 500 mg by mouth every 6 (six) hours as needed.    ? aspirin EC 81 MG tablet Take 81 mg by mouth daily. Swallow whole.    ? Cholecalciferol (VITAMIN D3 PO) Take 1,000 Units by mouth daily.    ? cyclobenzaprine (FLEXERIL) 5 MG tablet Take 5 mg by mouth daily as needed.    ? ibuprofen (ADVIL) 400 MG tablet Take 400 mg by mouth every 6 (six) hours as needed.    ? losartan (COZAAR) 50 MG tablet 1 tablet    ? Multiple Vitamins-Minerals (WOMENS MULTIVITAMIN PO) Take by mouth.    ? pravastatin (PRAVACHOL) 40 MG tablet pravastatin 40 mg tablet    ? ?No current facility-administered medications for this visit.  ? ? ?Allergies as of 02/12/2022 - Review Complete 02/12/2022  ?Allergen Reaction Noted  ? Sulfa antibiotics  03/12/2020  ? ? ?Family History  ?Problem Relation Age  of Onset  ? Thyroid disease Mother   ? Diabetes Mother   ? Osteoarthritis Mother   ? Hypertension Mother   ? Heart disease Mother   ? Obesity Mother   ? Lung cancer Father   ? Alcoholism Father   ? Colon cancer Maternal Grandfather   ? Stomach cancer Neg Hx   ? Rectal cancer Neg Hx   ? Esophageal cancer Neg Hx   ? Liver cancer Neg Hx   ? Pancreatic cancer Neg Hx   ? ? ?Social History  ? ?Socioeconomic History  ? Marital status: Married  ?  Spouse name: Cina Klumpp   ? Number of children: 1  ? Years of education: Not on file  ? Highest education  level: Not on file  ?Occupational History  ? Occupation: Retired  ?Tobacco Use  ? Smoking status: Former  ?  Types: Cigarettes  ?  Quit date: 11/11/2019  ?  Years since quitting: 2.2  ? Smokeless tobacco: Never  ?Vaping Use  ? Vaping Use: Never used  ?Substance and Sexual Activity  ? Alcohol use: Not Currently  ? Drug use: Never  ? Sexual activity: Not on file  ?Other Topics Concern  ? Not on file  ?Social History Narrative  ? Not on file  ? ?Social Determinants of Health  ? ?Financial Resource Strain: Not on file  ?Food Insecurity: Not on file  ?Transportation Needs: Not on file  ?Physical Activity: Not on file  ?Stress: Not on file  ?Social Connections: Not on file  ?Intimate Partner Violence: Not on file  ? ? ?Review of Systems: ?12 system ROS is negative except as noted above arthritis and voice changes.  ? ?Physical Exam: ?General:   Alert,  well-nourished, pleasant and cooperative in NAD ?Head:  Normocephalic and atraumatic. ?Eyes:  Sclera clear, no icterus.   Conjunctiva pink. ?Ears:  Normal auditory acuity. ?Nose:  No deformity, discharge,  or lesions. ?Mouth:  No deformity or lesions.   ?Neck:  Supple; no masses or thyromegaly. ?Lungs:  Clear throughout to auscultation.   No wheezes. ?Heart:  Regular rate and rhythm; no murmurs. ?Abdomen:  Soft, pain localized to the mid upper abdomen but I am unable to reduce the pain, nondistended, normal bowel sounds, no rebound or guarding. No hepatosplenomegaly.   ?Rectal:  Deferred  ?Msk:  Symmetrical. No boney deformities ?LAD: No inguinal or umbilical LAD ?Extremities:  No clubbing or edema. ?Neurologic:  Alert and  oriented x4;  grossly nonfocal ?Skin:  Intact without significant lesions or rashes. ?Psych:  Alert and cooperative. Normal mood and affect. ? ? ? ? ?Shamiya Demeritt L. Orvan Falconer, MD, MPH ?02/12/2022, 10:18 AM ? ? ? ?  ?

## 2022-02-12 NOTE — Patient Instructions (Signed)
If you are age 64 or older, your body mass index should be between 23-30. Your Body mass index is 39.9 kg/m?Marland Kitchen If this is out of the aforementioned range listed, please consider follow up with your Primary Care Provider. ? ?If you are age 86 or younger, your body mass index should be between 19-25. Your Body mass index is 39.9 kg/m?Marland Kitchen If this is out of the aformentioned range listed, please consider follow up with your Primary Care Provider.  ? ?________________________________________________________ ? ?The [facility] providers would like to encourage you to use [facility] to communicate with providers for non-urgent requests or questions.  Due to long hold times on the telephone, sending your provider a message by [facility] may be a faster and more efficient way to get a response.  Please allow 48 business hours for a response.  Please remember that this is for non-urgent requests.  ?_______________________________________________________ ? ? ?You have been scheduled for an endoscopy. Please follow written instructions given to you at your visit today. ?If you use inhalers (even only as needed), please bring them with you on the day of your procedure. ? ?Your provider has requested that you go to the basement level for lab work before leaving today. Press "B" on the elevator. The lab is located at the first door on the left as you exit the elevator. ? ?Due to recent changes in healthcare laws, you may see the results of your imaging and laboratory studies on MyChart before your provider has had a chance to review them.  We understand that in some cases there may be results that are confusing or concerning to you. Not all laboratory results come back in the same time frame and the provider may be waiting for multiple results in order to interpret others.  Please give [place] 48 hours in order for your provider to thoroughly review all the results before contacting the office for clarification of your results.  ? ?It was a  pleasure to see you today! ? ?Thank you for trusting me with your gastrointestinal care!   ? ? ?

## 2022-02-14 ENCOUNTER — Encounter: Payer: Self-pay | Admitting: Gastroenterology

## 2022-02-15 LAB — ALPHA-GAL PANEL
Allergen, Mutton, f88: <0.1 kU/L
Allergen, Pork, f26: <0.1 kU/L
Beef: <0.1 kU/L
CLASS: 0
CLASS: 0
Class: 0
GALACTOSE-ALPHA-1,3-GALACTOSE IGE*: <0.1 kU/L (ref <0.10)

## 2022-02-15 LAB — INTERPRETATION:

## 2022-03-01 ENCOUNTER — Telehealth: Payer: Self-pay | Admitting: Gastroenterology

## 2022-03-01 NOTE — Telephone Encounter (Signed)
I have reviewed the patient's prior colonoscopy report from Dr. Ewing Schlein at [facility].   ? ?Colonoscopy 04/23/2009 in [place] showed small internal hemorrhoids but was otherwise normal. ? ?Colonoscopy 08/02/2019: sessile serrated polyp, tubular adenoma, hyperplastic polyp, polypoid colonic mucosa with lymphoid aggregate.  He recommended surveillance colonoscopy in 5 years. ?

## 2022-03-03 NOTE — Telephone Encounter (Signed)
Recall placed per Dr. Orvan Falconer request. ?

## 2022-03-05 IMAGING — US US ABDOMEN COMPLETE
1 series · 14 of 25 positions shown · non-contrast
Comparison: None.

CLINICAL DATA: Epigastric pain for 6 months.

EXAM:
ABDOMEN ULTRASOUND COMPLETE

[Series 1: us abdomen complete · 0.15mm/px · 14 of 94 slices shown]
[im 1/94]
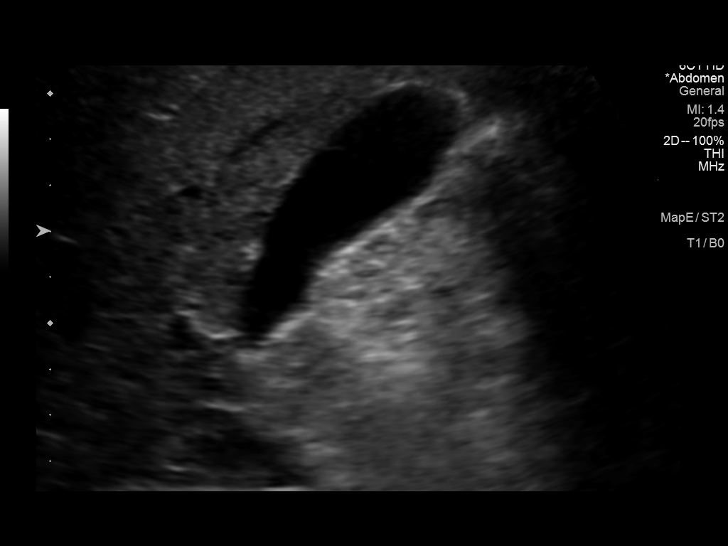
[im 8/94]
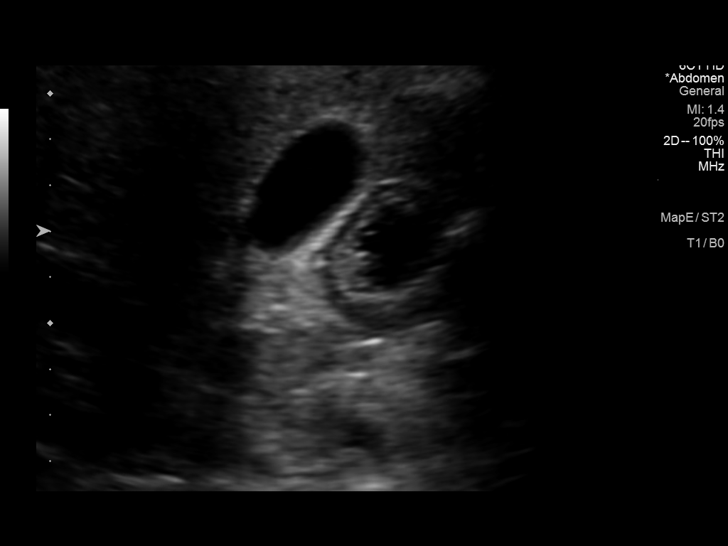
[im 16/94]
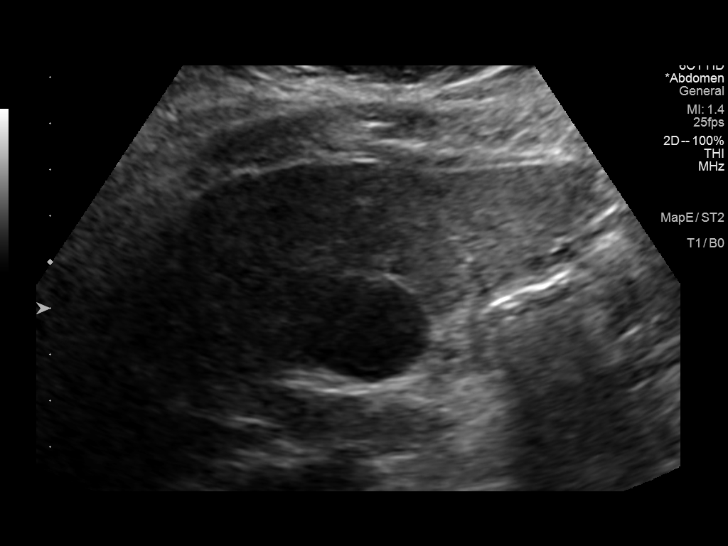
[im 24/94]
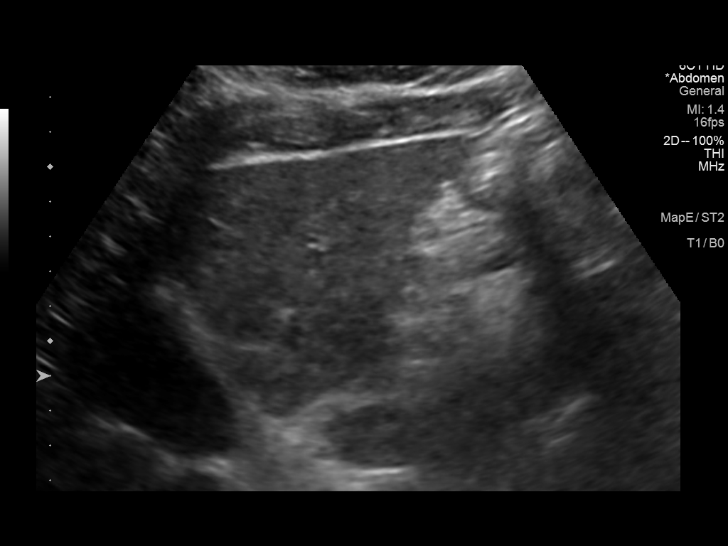
[im 32/94]
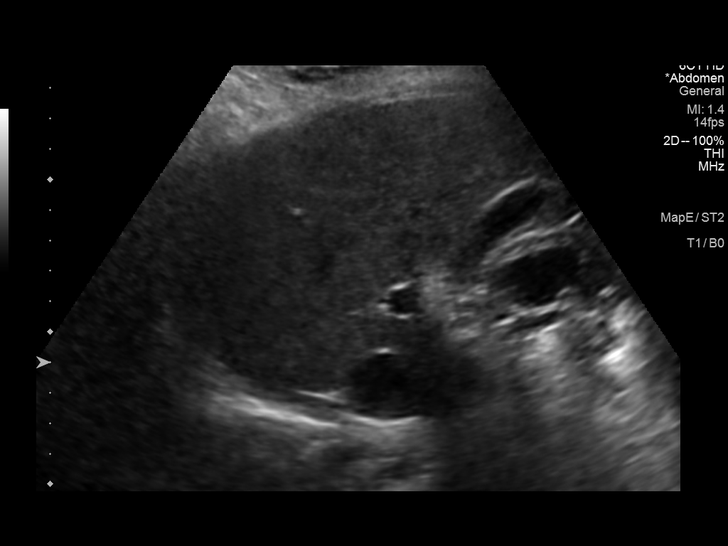
[im 35/94]
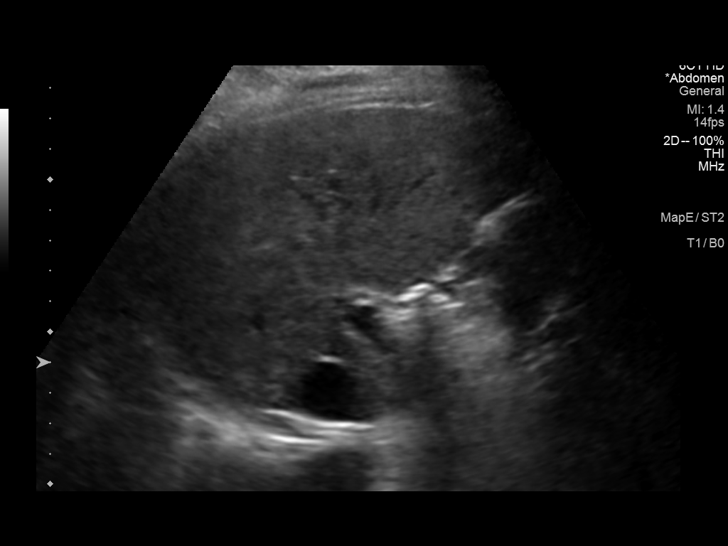
[im 43/94]
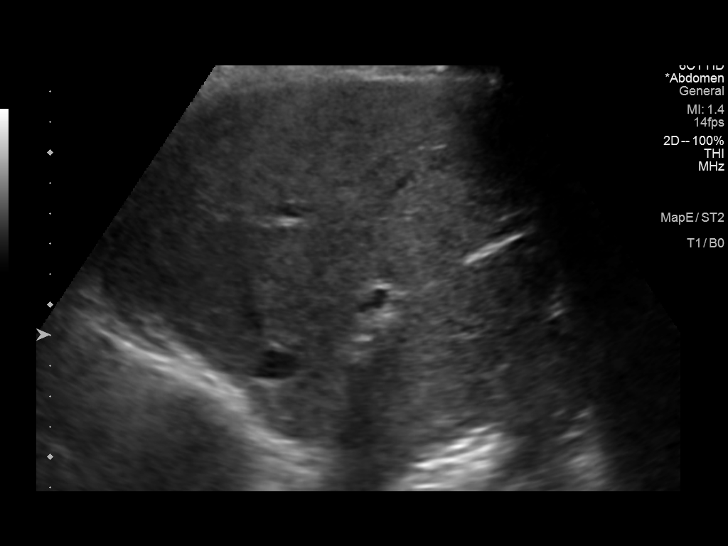
[im 51/94]
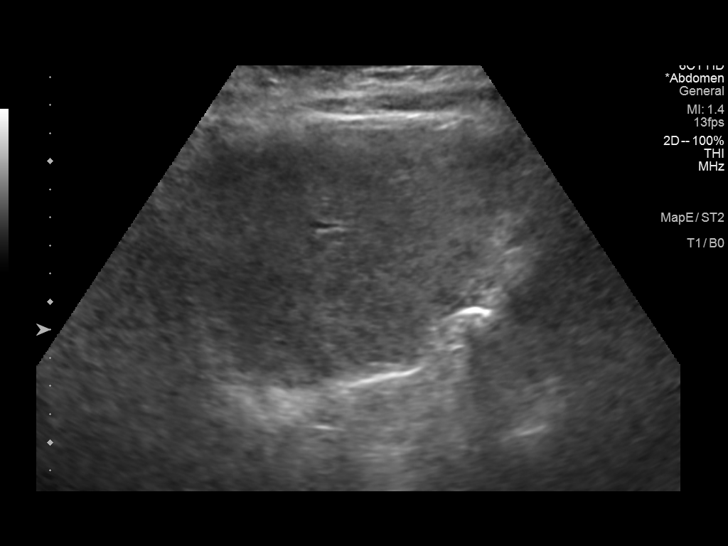
[im 59/94]
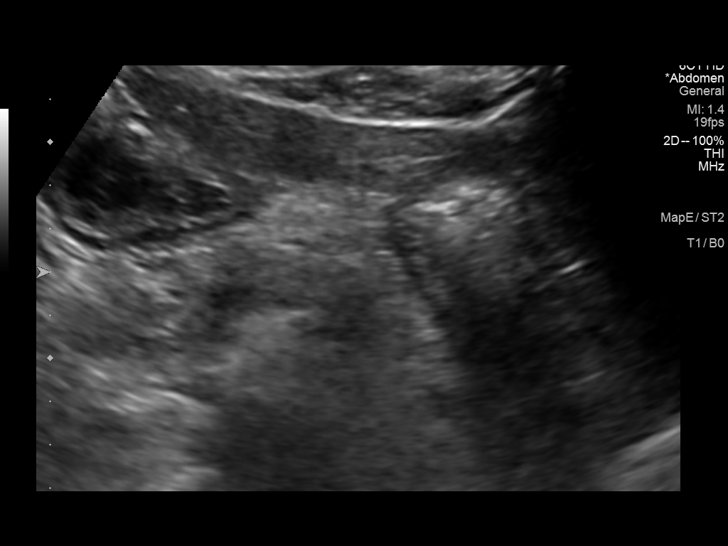
[im 63/94]
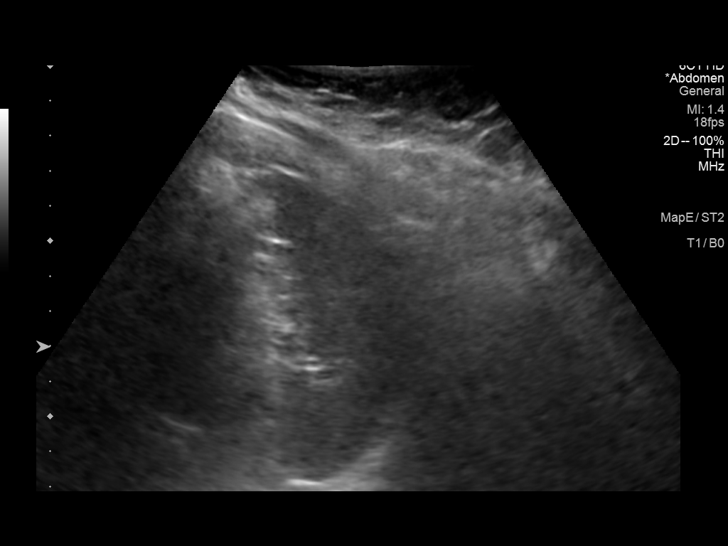
[im 70/94]
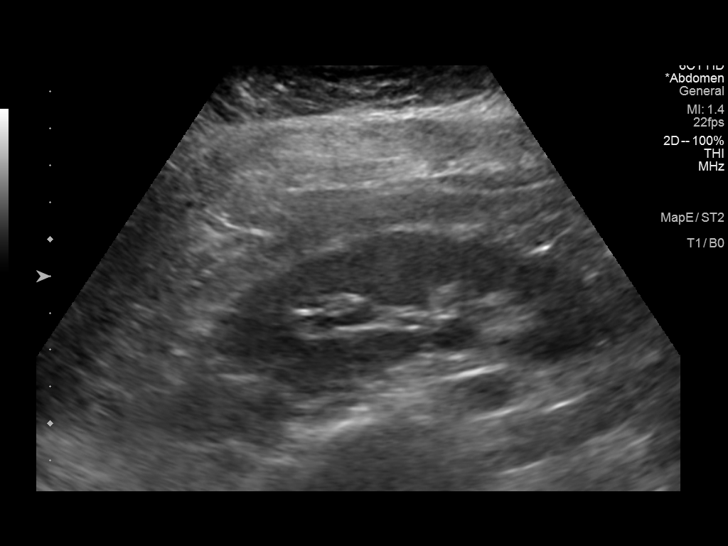
[im 78/94]
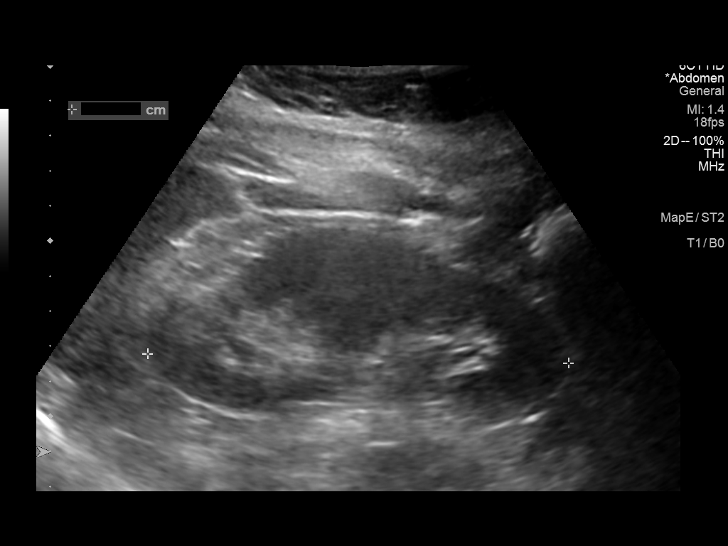
[im 86/94]
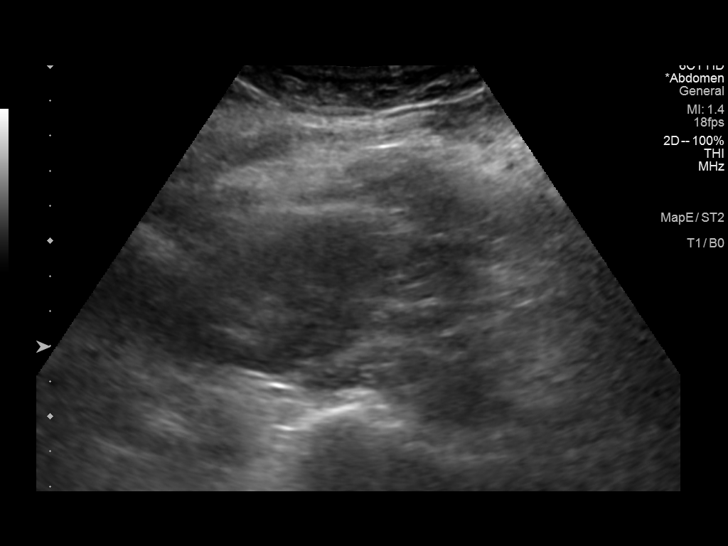
[im 94/94]
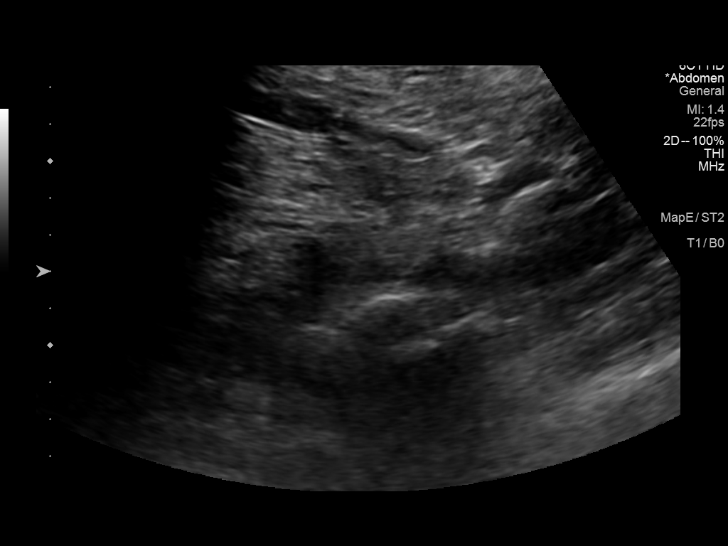

[14 of 25 positions shown; findings below may reference images not displayed]

FINDINGS: Gallbladder: No gallstones or wall thickening visualized. No
sonographic Murphy sign noted by sonographer.

Common bile duct: Diameter: 0.4 cm

Liver: No focal lesion identified. Echogenicity is mildly increased.
Portal vein is patent on color Doppler imaging with normal direction
of blood flow towards the liver.

IVC: No abnormality visualized.

Pancreas: Visualized portion unremarkable.

Spleen: Size and appearance within normal limits.

Right Kidney: Length: 11.6 cm. Echogenicity within normal limits. No
mass or hydronephrosis visualized.

Left Kidney: Length: 12.0 cm. Echogenicity within normal limits. No
mass or hydronephrosis visualized.

Abdominal aorta: No aneurysm visualized.

Other findings: None.
IMPRESSION: Mildly increased echogenicity of the liver suggestive of fatty
infiltration. The examination is otherwise normal.

## 2022-03-18 ENCOUNTER — Encounter: Payer: Self-pay | Admitting: Gastroenterology

## 2022-03-25 ENCOUNTER — Encounter: Payer: Self-pay | Admitting: Gastroenterology

## 2022-03-25 ENCOUNTER — Ambulatory Visit (AMBULATORY_SURGERY_CENTER): Payer: Managed Care, Other (non HMO) | Admitting: Gastroenterology

## 2022-03-25 VITALS — BP 134/74 | HR 85 | Temp 97.3°F | Resp 17 | Ht 63.0 in | Wt 225.0 lb

## 2022-03-25 DIAGNOSIS — K2100 Gastro-esophageal reflux disease with esophagitis, without bleeding: Secondary | ICD-10-CM | POA: Diagnosis not present

## 2022-03-25 DIAGNOSIS — K2960 Other gastritis without bleeding: Secondary | ICD-10-CM

## 2022-03-25 DIAGNOSIS — B9681 Helicobacter pylori [H. pylori] as the cause of diseases classified elsewhere: Secondary | ICD-10-CM

## 2022-03-25 DIAGNOSIS — K449 Diaphragmatic hernia without obstruction or gangrene: Secondary | ICD-10-CM | POA: Diagnosis not present

## 2022-03-25 DIAGNOSIS — R1084 Generalized abdominal pain: Secondary | ICD-10-CM | POA: Diagnosis present

## 2022-03-25 DIAGNOSIS — K2950 Unspecified chronic gastritis without bleeding: Secondary | ICD-10-CM | POA: Diagnosis not present

## 2022-03-25 MED ORDER — SODIUM CHLORIDE 0.9 % IV SOLN: 500.0000 mL | Freq: Once | INTRAVENOUS | Status: DC

## 2022-03-25 MED ORDER — FAMOTIDINE 20 MG PO TABS: 20.0000 mg | ORAL_TABLET | Freq: Two times a day (BID) | ORAL | 3 refills | Status: DC

## 2022-03-25 NOTE — Op Note (Signed)
[facility] ?Patient Name: Danielle Mcdonald ?Procedure Date: 03/25/2022 1:49 PM ?MRN: [id number] ?Endoscopist: Tressia Danas MD, MD ?Age: 65 ?Referring MD:  ?Date of Birth: [birth year] ?Gender: Female ?Account #: [id number] ?Procedure:                Upper GI endoscopy ?Indications:              Upper abdominal pain, Suspected esophageal reflux,  ?                          Abdominal bloating ?Medicines:                Monitored Anesthesia Care ?Procedure:                Pre-Anesthesia Assessment: ?                          - Prior to the procedure, a History and Physical  ?                          was performed, and patient medications and  ?                          allergies were reviewed. The patient's tolerance of  ?                          previous anesthesia was also reviewed. The risks  ?                          and benefits of the procedure and the sedation  ?                          options and risks were discussed with the patient.  ?                          All questions were answered, and informed consent  ?                          was obtained. Prior Anticoagulants: The patient has  ?                          taken no previous anticoagulant or antiplatelet  ?                          agents. ASA Grade Assessment: III - A patient with  ?                          severe systemic disease. After reviewing the risks  ?                          and benefits, the patient was deemed in  ?                          satisfactory condition to undergo the procedure. ?  After obtaining informed consent, the endoscope was  ?                          passed under direct vision. Throughout the  ?                          procedure, the patient's blood pressure, pulse, and  ?                          oxygen saturations were monitored continuously. The  ?                          GIF [id number] #[id number] was introduced through the  ?                          mouth, and advanced to the third  part of duodenum.  ?                          The upper GI endoscopy was accomplished without  ?                          difficulty. The patient tolerated the procedure  ?                          well. ?Scope In: ?Scope Out: ?Findings:                 LA Grade A (one or more mucosal breaks less than 5  ?                          mm, not extending between tops of 2 mucosal folds)  ?                          esophagitis with no bleeding was found 38 cm from  ?                          the incisors. Biopsies were taken with a cold  ?                          forceps for histology. Estimated blood loss was  ?                          minimal. ?                          The entire examined stomach was normal. Biopsies  ?                          were taken from the antrum, body, and fundus with a  ?                          cold forceps for histology. Estimated blood loss  ?  was minimal. ?                          A hiatal hernia was present. ?                          The examined duodenum was normal. Biopsies were  ?                          taken with a cold forceps for histology. Estimated  ?                          blood loss was minimal. ?Complications:            No immediate complications. ?Estimated Blood Loss:     Estimated blood loss was minimal. ?Impression:               - LA Grade A reflux esophagitis with no bleeding.  ?                          Biopsied. ?                          - Normal stomach. Biopsied. ?                          - Normal examined duodenum. Biopsied. ?Recommendation:           - Patient has a contact number available for  ?                          emergencies. The signs and symptoms of potential  ?                          delayed complications were discussed with the  ?                          patient. Return to normal activities tomorrow.  ?                          Written discharge instructions were provided to the  ?                           patient. ?                          - Resume previous diet. ?                          - Continue present medications. ?                          - Start famotidine 20 mg BID. ?                          - Await pathology results. ?Tressia DanasKimberly Ellanor Feuerstein MD, MD ?03/25/2022 2:07:44 PM ?This report has been signed electronically. ?

## 2022-03-25 NOTE — Progress Notes (Signed)
? ?Referring Provider: Fatima Sanger, FNP ?Primary Care Physician:  Fatima Sanger, FNP ? ?Indication for Upper Endoscopy:  Upper abdominal pain ? ? ?IMPRESSION:  ?Postprandial upper abdominal pain, gas, and bloating ?Intermittent nocturnal reflux ? ? ?PLAN: ?EGD with gastric biopsies for H pylori and duodenal biopsies for celiac ? ? ? ? ?HPI: Danielle Mcdonald is a 66 y.o. female who presents for endoscopic evaluation of abdominal pain. She has a history of hypercholesterolemia, hypertension, obesity, inflammatory arthritis, asthma, and sleep apnea.  ? ?Upper abdominal pain that develops while she's eating over the last 3-4 years. At times only one bite will trigger her pain and she will hold her abdomen for support. Associated bloating, eructation, and bloating. Some nocturnal reflux. Weight is stable. Appetite is good. Described as a cutting pain that she feels until the food passes. Loose stools recently. No history of constipation. No recent change in symptoms. ? ?Ginger chews after meals provide some relief. No other identified exacerbating or relieving features except she has been eating more [place] and avoiding red meat.  ? ?She uses ibuprofen more heavily in the past for a torn rotator cuff. She tries to use Tylenol although it doesn't work as well.  ? ?Saw Dr. Ewing Schlein in 2020 for the same symptoms. She had an ultrasound 03/27/20 that showed an echogenic liver.  ? ?A trial of pantoprazole caused arthralgias after several weeks.  ? ?Two prior colonoscopy, most recent Colonoscopy with Dr. Ewing Schlein 4-5 years ago. She had polyps.  ? ?There is no known family history of colon cancer or polyps. No family history of stomach cancer or other GI malignancy. No family history of inflammatory bowel disease or celiac.  ? ? ?Past Medical History:  ?Diagnosis Date  ? Asthma   ? Digestive disorder   ? GERD (gastroesophageal reflux disease)   ? High cholesterol   ? Hypertension   ? Joint pain   ? Lactose intolerance    ? Sleep apnea   ? ? ?Past Surgical History:  ?Procedure Laterality Date  ? TONSILLECTOMY    ? TUBAL LIGATION    ? ?Current Outpatient Medications  ?Medication Sig Dispense Refill  ? aspirin EC 81 MG tablet Take 81 mg by mouth daily. Swallow whole.    ? Cholecalciferol (VITAMIN D3 PO) Take 1,000 Units by mouth daily.    ? lisinopril (ZESTRIL) 20 MG tablet Take 20 mg by mouth daily.    ? Multiple Vitamins-Minerals (WOMENS MULTIVITAMIN PO) Take by mouth.    ? pravastatin (PRAVACHOL) 40 MG tablet pravastatin 40 mg tablet    ? acetaminophen (TYLENOL) 500 MG tablet Take 500 mg by mouth every 6 (six) hours as needed.    ? cyclobenzaprine (FLEXERIL) 5 MG tablet Take 5 mg by mouth daily as needed.    ? ibuprofen (ADVIL) 400 MG tablet Take 400 mg by mouth every 6 (six) hours as needed.    ? ?Current Facility-Administered Medications  ?Medication Dose Route Frequency Provider Last Rate Last Admin  ? 0.9 %  sodium chloride infusion  500 mL Intravenous Once Tressia Danas, MD      ? ? ?Allergies as of 03/25/2022 - Review Complete 03/25/2022  ?Allergen Reaction Noted  ? Lisinopril  03/06/2022  ? Sulfa antibiotics  03/12/2020  ? ? ?Family History  ?Problem Relation Age of Onset  ? Thyroid disease Mother   ? Diabetes Mother   ? Osteoarthritis Mother   ? Hypertension Mother   ? Heart disease Mother   ?  Obesity Mother   ? Lung cancer Father   ? Alcoholism Father   ? Colon cancer Maternal Grandfather   ? Stomach cancer Neg Hx   ? Rectal cancer Neg Hx   ? Esophageal cancer Neg Hx   ? Liver cancer Neg Hx   ? Pancreatic cancer Neg Hx   ? ? ? ? ?Physical Exam: ?General:   Alert,  well-nourished, pleasant and cooperative in NAD ?Head:  Normocephalic and atraumatic. ?Eyes:  Sclera clear, no icterus.   Conjunctiva pink. ?Ears:  Normal auditory acuity. ?Nose:  No deformity, discharge,  or lesions. ?Mouth:  No deformity or lesions.   ?Neck:  Supple; no masses or thyromegaly. ?Lungs:  Clear throughout to auscultation.   No wheezes. ?Heart:   Regular rate and rhythm; no murmurs. ?Abdomen:  Soft, pain localized to the mid upper abdomen but I am unable to reduce the pain, nondistended, normal bowel sounds, no rebound or guarding. No hepatosplenomegaly.   ?Rectal:  Deferred  ?Msk:  Symmetrical. No boney deformities ?LAD: No inguinal or umbilical LAD ?Extremities:  No clubbing or edema. ?Neurologic:  Alert and  oriented x4;  grossly nonfocal ?Skin:  Intact without significant lesions or rashes. ?Psych:  Alert and cooperative. Normal mood and affect. ? ? ? ? ?Chequita Mofield L. Orvan Falconer, MD, MPH ?03/25/2022, 1:48 PM ? ? ? ?  ?

## 2022-03-25 NOTE — Patient Instructions (Signed)
YOU HAD AN ENDOSCOPIC PROCEDURE TODAY AT THE [facility]:   Refer to the procedure report that was given to you for any specific questions about what was found during the examination.  If the procedure report does not answer your questions, please call your gastroenterologist to clarify.  If you requested that your care partner not be given the details of your procedure findings, then the procedure report has been included in a sealed envelope for you to review at your convenience later.  YOU SHOULD EXPECT: Some feelings of bloating in the abdomen. Passage of more gas than usual.  Walking can help get rid of the air that was put into your GI tract during the procedure and reduce the bloating. If you had a lower endoscopy (such as a colonoscopy or flexible sigmoidoscopy) you may notice spotting of blood in your stool or on the toilet paper. If you underwent a bowel prep for your procedure, you may not have a normal bowel movement for a few days.  Please Note:  You might notice some irritation and congestion in your nose or some drainage.  This is from the oxygen used during your procedure.  There is no need for concern and it should clear up in a day or so.  SYMPTOMS TO REPORT IMMEDIATELY:    Following upper endoscopy (EGD)  Vomiting of blood or coffee ground material  New chest pain or pain under the shoulder blades  Painful or persistently difficult swallowing  New shortness of breath  Fever of 100F or higher  Black, tarry-looking stools  For urgent or emergent issues, a gastroenterologist can be reached at any hour by calling [phone]. Do not use MyChart messaging for urgent concerns.    DIET:  We do recommend a small meal at first, but then you may proceed to your regular diet.  Drink plenty of fluids but you should avoid alcoholic beverages for 24 hours.  ACTIVITY:  You should plan to take it easy for the rest of today and you should NOT DRIVE or use heavy machinery  until tomorrow (because of the sedation medicines used during the test).    FOLLOW UP: Our staff will call the number listed on your records 48-72 hours following your procedure to check on you and address any questions or concerns that you may have regarding the information given to you following your procedure. If we do not reach you, we will leave a message.  We will attempt to reach you two times.  During this call, we will ask if you have developed any symptoms of COVID 19. If you develop any symptoms (ie: fever, flu-like symptoms, shortness of breath, cough etc.) before then, please call [phone].  If you test positive for Covid 19 in the 2 weeks post procedure, please call and report this information to us.    If any biopsies were taken you will be contacted by phone or by letter within the next 1-3 weeks.  Please call us at [phone] if you have not heard about the biopsies in 3 weeks.    SIGNATURES/CONFIDENTIALITY: You and/or your care partner have signed paperwork which will be entered into your electronic medical record.  These signatures attest to the fact that that the information above on your After Visit Summary has been reviewed and is understood.  Full responsibility of the confidentiality of this discharge information lies with you and/or your care-partner. 

## 2022-03-27 ENCOUNTER — Telehealth: Payer: Self-pay | Admitting: *Deleted

## 2022-03-27 ENCOUNTER — Telehealth: Payer: Self-pay

## 2022-03-27 NOTE — Telephone Encounter (Signed)
  Follow up Call-     03/25/2022    1:07 PM  Call back number  Post procedure Call Back phone  # [phone]  Permission to leave phone message Yes     Patient questions:  Message left to call us if necessary.

## 2022-03-27 NOTE — Telephone Encounter (Signed)
Left message on follow up call. 

## 2022-04-01 ENCOUNTER — Other Ambulatory Visit: Payer: Self-pay

## 2022-04-01 DIAGNOSIS — B9681 Helicobacter pylori [H. pylori] as the cause of diseases classified elsewhere: Secondary | ICD-10-CM

## 2022-04-01 MED ORDER — PANTOPRAZOLE SODIUM 40 MG PO TBEC: 40.0000 mg | DELAYED_RELEASE_TABLET | Freq: Two times a day (BID) | ORAL | 0 refills | Status: DC

## 2022-04-01 MED ORDER — BISMUTH/METRONIDAZ/TETRACYCLIN 140-125-125 MG PO CAPS: 1.0000 | ORAL_CAPSULE | Freq: Four times a day (QID) | ORAL | 0 refills | Status: DC

## 2022-04-03 ENCOUNTER — Other Ambulatory Visit: Payer: Self-pay | Admitting: Registered Nurse

## 2022-04-03 ENCOUNTER — Encounter: Payer: Self-pay | Admitting: Gastroenterology

## 2022-04-03 DIAGNOSIS — Z1231 Encounter for screening mammogram for malignant neoplasm of breast: Secondary | ICD-10-CM

## 2022-04-04 ENCOUNTER — Telehealth: Payer: Self-pay | Admitting: Gastroenterology

## 2022-04-04 NOTE — Telephone Encounter (Signed)
Addressed in previously created encounters and letter.

## 2022-04-04 NOTE — Telephone Encounter (Signed)
Patient called requesting to speak with nurse regarding new medications. Patient also states having questions regarding results from procedure. Please advise.

## 2022-04-21 ENCOUNTER — Other Ambulatory Visit: Payer: Managed Care, Other (non HMO)

## 2022-04-21 DIAGNOSIS — B9681 Helicobacter pylori [H. pylori] as the cause of diseases classified elsewhere: Secondary | ICD-10-CM

## 2022-04-21 NOTE — Progress Notes (Unsigned)
04/23/2022 Danielle Mcdonald [id number] 01-12-1956  Referring provider: Holland Commons, FNP Primary GI doctor: Dr. Tarri Glenn  ASSESSMENT AND PLAN:   Helicobacter pylori gastritis -     Helicobacter pylori special antigen; Future 80 % improvement, will repeat stool since was done too soon.   Generalized postprandial abdominal pain -     US ABDOMEN LIMITED RUQ (LIVER/GB); Future Family history of GB issues, worse with fatty foods, some back pain with it and continuing after treatment Will plan for RUQ [place], if continues can consider HIDA  Screening colonoscopy  Last done with Dr. Barron Schmid 07/2019, due 07/2024  History of Present Illness:  66 y.o. female  with a past medical history of hyperlipidemia, hypertension, OSA on CPAP, obesity, postprandial abdominal pain and others listed below, returns to clinic today for evaluation of GERD. 03/25/2022 upper endoscopy LA grade a reflux esophagitis, normal stomach, normal duodenum pathology showed H. pylori gastritis.   Given quadruple therapy with bismuth subsalicylate [id number] mg 4 times daily daily, metronidazole 500 mg 4 times daily, tetracycline 500 mg 4 times daily, and pantoprazole 40 mg twice daily- ?  Started 5/26- returned stool early, will need another today.  03/27/2020 abdominal ultrasound showing echogenic liver Colonoscopy with Dr. Watt Climes 4 to 5 years ago  She is on famotidine twice a day, she completed her antibiotic therapy.  Patient states it is 80% better.  She still has issues with greasy foods, burgers.  Mother and 3 siblings with GB removed.  No blood in stool, no melena, no GERD, nausea or vomiting.   Current Medications:    Current Outpatient Medications (Cardiovascular):    lisinopril (ZESTRIL) 20 MG tablet, Take 20 mg by mouth daily.   pravastatin (PRAVACHOL) 40 MG tablet, pravastatin 40 mg tablet   Current Outpatient Medications (Analgesics):    acetaminophen (TYLENOL) 500 MG tablet, Take 500 mg by mouth  every 6 (six) hours as needed.   aspirin EC 81 MG tablet, Take 81 mg by mouth daily. Swallow whole.   ibuprofen (ADVIL) 400 MG tablet, Take 400 mg by mouth every 6 (six) hours as needed.   Current Outpatient Medications (Other):    Cholecalciferol (VITAMIN D3 PO), Take 1,000 Units by mouth daily.   cyclobenzaprine (FLEXERIL) 5 MG tablet, Take 5 mg by mouth daily as needed.   famotidine (PEPCID) 20 MG tablet, Take 1 tablet (20 mg total) by mouth 2 (two) times daily.   Multiple Vitamins-Minerals (WOMENS MULTIVITAMIN PO), Take by mouth.  Surgical History:  She  has a past surgical history that includes Tonsillectomy; Tubal ligation; and Esophagogastroduodenoscopy. Family History:  Her family history includes Alcoholism in her father; Colon cancer in her maternal grandfather; Diabetes in her mother; Heart disease in her mother; Hypertension in her mother; Lung cancer in her father; Obesity in her mother; Osteoarthritis in her mother; Thyroid disease in her mother. Social History:   reports that she quit smoking about 2 years ago. Her smoking use included cigarettes. She has never used smokeless tobacco. She reports that she does not currently use alcohol. She reports that she does not use drugs.  Current Medications, Allergies, Past Medical History, Past Surgical History, Family History and Social History were reviewed in [employer] record.  Physical Exam: BP 134/78   Pulse 65   Ht 5' 3.5" (1.613 m)   Wt 227 lb (103 kg)   SpO2 98%   BMI 39.58 kg/m  General:   Pleasant, well developed female in no  acute distress Heart : Regular rate and rhythm; no murmurs Pulm: Clear anteriorly; no wheezing Abdomen:  Soft, Obese AB, Active bowel sounds. mild tenderness in the epigastrium. Without guarding and Without rebound, No organomegaly appreciated. Rectal: Not evaluated Extremities:  with  edema, left greater than right into the forefoot, no erythema, warmth.  Neurologic:   Alert and  oriented x4;  No focal deficits.  Psych:  Cooperative. Normal mood and affect.   Vladimir Crofts, PA-C 04/23/22

## 2022-04-22 LAB — HELICOBACTER PYLORI  SPECIAL ANTIGEN
MICRO NUMBER:: 13513252
SPECIMEN QUALITY: ADEQUATE

## 2022-04-23 ENCOUNTER — Other Ambulatory Visit: Payer: Managed Care, Other (non HMO)

## 2022-04-23 ENCOUNTER — Ambulatory Visit (INDEPENDENT_AMBULATORY_CARE_PROVIDER_SITE_OTHER): Payer: Managed Care, Other (non HMO) | Admitting: Physician Assistant

## 2022-04-23 ENCOUNTER — Encounter: Payer: Self-pay | Admitting: Physician Assistant

## 2022-04-23 ENCOUNTER — Ambulatory Visit
Admission: RE | Admit: 2022-04-23 | Discharge: 2022-04-23 | Disposition: A | Discharge status: Home / Self Care | Payer: Managed Care, Other (non HMO) | Source: Ambulatory Visit | Attending: Registered Nurse | Admitting: Registered Nurse

## 2022-04-23 VITALS — BP 134/78 | HR 65 | Ht 63.5 in | Wt 227.0 lb

## 2022-04-23 DIAGNOSIS — R1084 Generalized abdominal pain: Secondary | ICD-10-CM | POA: Diagnosis not present

## 2022-04-23 DIAGNOSIS — K2970 Gastritis, unspecified, without bleeding: Secondary | ICD-10-CM | POA: Diagnosis not present

## 2022-04-23 DIAGNOSIS — B9681 Helicobacter pylori [H. pylori] as the cause of diseases classified elsewhere: Secondary | ICD-10-CM

## 2022-04-23 DIAGNOSIS — Z1231 Encounter for screening mammogram for malignant neoplasm of breast: Secondary | ICD-10-CM

## 2022-04-23 NOTE — Patient Instructions (Addendum)
If you are age 64 or younger, your body mass index should be between 19-25. Your Body mass index is 39.58 kg/m. If this is out of the aformentioned range listed, please consider follow up with your Primary Care Provider.  ________________________________________________________  The [facility] providers would like to encourage you to use [facility] to communicate with providers for non-urgent requests or questions.  Due to long hold times on the telephone, sending your provider a message by [facility] may be a faster and more efficient way to get a response.  Please allow 48 business hours for a response.  Please remember that this is for non-urgent requests.  _______________________________________________________   Your provider has requested that you go to the basement level for lab work before leaving today. Press "B" on the elevator. The lab is located at the first door on the left as you exit the elevator.  You have been scheduled for an abdominal ultrasound at [facility] (1st floor of hospital) on 05/01/2022 at 9:00 am. Please arrive 15 minutes prior to your appointment for registration. Make certain not to have anything to eat or drink 6 hours prior to your appointment. Should you need to reschedule your appointment, please contact radiology at [phone]. This test typically takes about 30 minutes to perform.  Follow up pending  Thank you for entrusting me with your care and choosing [facility].  Quentin Mulling, PA-C  Avoid spicy and acidic foods Avoid fatty foods Limit your intake of coffee, tea, alcohol, and carbonated drinks Work to maintain a healthy weight Keep the head of the bed elevated at least 3 inches with blocks or a wedge pillow if you are having any nighttime symptoms Stay upright for 2 hours after eating Avoid meals and snacks three to four hours before bedtime  Fatty liver or Nonalcoholic fatty liver disease (NASH)  Now the leading cause of liver  failure in the [place].  It is normally from such risk factors as obesity, diabetes, insulin resistance, high cholesterol, or metabolic syndrome.  The only definitive therapy is weight loss and exercise.    Suggest walking 20-30 mins daily.  Decreasing carbohydrates, increasing veggies.    Fatty Liver Fatty liver is the accumulation of fat in liver cells. It is also called hepatosteatosis or steatohepatitis. It is normal for your liver to contain some fat. If fat is more than 5 to 10% of your liver's weight, you have fatty liver.  There are often no symptoms (problems) for years while damage is still occurring. People often learn about their fatty liver when they have medical tests for other reasons. Fat can damage your liver for years or even decades without causing problems. When it becomes severe, it can cause fatigue, weight loss, weakness, and confusion. This makes you more likely to develop more serious liver problems. The liver is the largest organ in the body. It does a lot of work and often gives no warning signs when it is sick until late in a disease. The liver has many important jobs including: Breaking down foods. Storing vitamins, iron, and other minerals. Making proteins. Making bile for food digestion. Breaking down many products including medications, alcohol and some poisons.  PROGNOSIS  Fatty liver may cause no damage or it can lead to an inflammation of the liver. This is, called steatohepatitis.  Over time the liver may become scarred and hardened. This condition is called cirrhosis. Cirrhosis is serious and may lead to liver failure or cancer. NASH is  one of the leading causes of cirrhosis. About 10-20% of Americans have fatty liver and a smaller 2-5% has NASH.  TREATMENT  Weight loss, fat restriction, and exercise in overweight patients produces inconsistent results but is worth trying. Good control of diabetes may reduce fatty liver. Eat a balanced, healthy  diet. Increase your physical activity. There are no medical or surgical treatments for a fatty liver or NASH, but improving your diet and increasing your exercise may help prevent or reverse some of the damage.

## 2022-04-24 NOTE — Progress Notes (Signed)
Reviewed and agree with management plans. ? ?Mathew Postiglione L. Ly Bacchi, MD, MPH  ?

## 2022-04-28 ENCOUNTER — Other Ambulatory Visit: Payer: Managed Care, Other (non HMO)

## 2022-04-28 DIAGNOSIS — B9681 Helicobacter pylori [H. pylori] as the cause of diseases classified elsewhere: Secondary | ICD-10-CM

## 2022-04-29 ENCOUNTER — Other Ambulatory Visit: Payer: Self-pay

## 2022-04-29 DIAGNOSIS — R600 Localized edema: Secondary | ICD-10-CM

## 2022-04-29 LAB — HELICOBACTER PYLORI  SPECIAL ANTIGEN
MICRO NUMBER:: 13542535
SPECIMEN QUALITY: ADEQUATE

## 2022-05-01 ENCOUNTER — Ambulatory Visit (HOSPITAL_COMMUNITY)
Admission: RE | Admit: 2022-05-01 | Discharge: 2022-05-01 | Disposition: A | Discharge status: Home / Self Care | Payer: Managed Care, Other (non HMO) | Source: Ambulatory Visit | Attending: Physician Assistant | Admitting: Physician Assistant

## 2022-05-01 DIAGNOSIS — R1084 Generalized abdominal pain: Secondary | ICD-10-CM

## 2022-05-07 ENCOUNTER — Ambulatory Visit: Payer: Managed Care, Other (non HMO)

## 2022-05-07 DIAGNOSIS — R600 Localized edema: Secondary | ICD-10-CM

## 2022-06-18 ENCOUNTER — Encounter (INDEPENDENT_AMBULATORY_CARE_PROVIDER_SITE_OTHER): Payer: Self-pay

## 2022-09-08 ENCOUNTER — Encounter: Payer: Self-pay | Admitting: Pulmonary Disease

## 2022-09-08 ENCOUNTER — Ambulatory Visit (INDEPENDENT_AMBULATORY_CARE_PROVIDER_SITE_OTHER): Payer: Managed Care, Other (non HMO) | Admitting: Pulmonary Disease

## 2022-09-08 VITALS — BP 126/80 | HR 79 | Temp 98.5°F | Ht 63.5 in | Wt 214.0 lb

## 2022-09-08 DIAGNOSIS — R0602 Shortness of breath: Secondary | ICD-10-CM | POA: Diagnosis not present

## 2022-09-08 NOTE — Patient Instructions (Addendum)
I will see you about a year from now  PFT at next visit  Graded exercises as tolerated  Continue to work on quitting smoking  Call us with significant concerns

## 2022-09-08 NOTE — Progress Notes (Signed)
Danielle Mcdonald    [id number]    September 21, 1956  Primary Care Physician:Prevost, Leonia Reader, FNP  Referring Physician: Holland Commons, [address] [facility] [address] [place],  [place] [address]  Chief complaint:   Breathing has been relatively stable Shortness of breath on exertion  HPI:  Shortness of breath on exertion  Mild emphysema on CT PFT within normal limits, mild hyperinflation  Breathing overall is stable  Has a history of obstructive sleep apnea, follows up with Dr. 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 Delma Officer with CPAP and well-controlled  Usually gets about 8 hours of sleep every night and feels rested  Has a history of hypertension, hypercholesterolemia: History of knee pain and joint discomfort   Outpatient Encounter Medications as of 09/08/2022  Medication Sig   acetaminophen (TYLENOL) 500 MG tablet Take 500 mg by mouth every 6 (six) hours as needed.   aspirin EC 81 MG tablet Take 81 mg by mouth daily. Swallow whole.   Cholecalciferol (VITAMIN D3 PO) Take 1,000 Units by mouth daily.   cyclobenzaprine (FLEXERIL) 5 MG tablet Take 5 mg by mouth daily as needed.   famotidine (PEPCID) 20 MG tablet Take 1 tablet (20 mg total) by mouth 2 (two) times daily.   ibuprofen (ADVIL) 400 MG tablet Take 400 mg by mouth every 6 (six) hours as needed.   lisinopril (ZESTRIL) 20 MG tablet Take 20 mg by mouth daily.   Multiple Vitamins-Minerals (WOMENS MULTIVITAMIN PO) Take by mouth.   pravastatin (PRAVACHOL) 40 MG tablet pravastatin 40 mg tablet   No facility-administered encounter medications on file as of 09/08/2022.    Allergies as of 09/08/2022 - Review Complete 09/08/2022  Allergen Reaction Noted   Lisinopril  03/06/2022   Sulfa antibiotics Hives 03/12/2020    Past Medical  History:  Diagnosis Date   Asthma    Digestive disorder    GERD (gastroesophageal reflux disease)    High cholesterol    Hypertension    Joint pain    Lactose intolerance    Sleep apnea     Past Surgical History:  Procedure Laterality Date   ESOPHAGOGASTRODUODENOSCOPY     TONSILLECTOMY     TUBAL LIGATION      Family History  Problem Relation Age of Onset   Thyroid disease Mother    Diabetes Mother    Osteoarthritis Mother    Hypertension Mother    Heart disease Mother    Obesity Mother    Lung cancer Father    Alcoholism Father    Colon cancer Maternal Grandfather    Stomach cancer Neg Hx    Rectal cancer Neg Hx    Esophageal cancer Neg Hx    Liver cancer Neg Hx    Pancreatic cancer Neg Hx     Social History   Socioeconomic History   Marital status: Married    Spouse name: Sakiyah Shur    Number of children: 1   Years of education: Not on file   Highest education level: Not on file  Occupational History   Occupation: Retired  Tobacco Use   Smoking status: Some Days    Packs/day: 0.50    Types: Cigarettes   Smokeless tobacco: Never   Tobacco comments:    Socially smoke 1-2 cigs.   Vaping Use  Vaping Use: Never used  Substance and Sexual Activity   Alcohol use: Not Currently   Drug use: Never   Sexual activity: Not on file  Other Topics Concern   Not on file  Social History Narrative   Not on file   Social Determinants of Health   Financial Resource Strain: Not on file  Food Insecurity: Not on file  Transportation Needs: Not on file  Physical Activity: Not on file  Stress: Not on file  Social Connections: Not on file  Intimate Partner Violence: Not on file    Review of Systems  Constitutional:  Negative for fatigue.  Respiratory:  Positive for apnea. Negative for cough and shortness of breath.     Vitals:   09/08/22 1125  BP: 126/80  Pulse: 79  Temp: 98.5 F (36.9 C)  SpO2: 98%      Physical Exam Constitutional:       Appearance: She is obese.  HENT:     Head: Normocephalic.     Mouth/Throat:     Mouth: Mucous membranes are moist.  Eyes:     General:        Right eye: No discharge.        Left eye: No discharge.  Cardiovascular:     Rate and Rhythm: Normal rate and regular rhythm.     Heart sounds: No murmur heard.    No friction rub.  Pulmonary:     Effort: No respiratory distress.     Breath sounds: No stridor. No wheezing or rhonchi.  Musculoskeletal:     Cervical back: No rigidity or tenderness.  Neurological:     Mental Status: She is alert.  Psychiatric:        Mood and Affect: Mood normal.   Data Reviewed: Recent PFT reviewed showing normal PFT, mild hyperinflation  CT lung cancer screening reviewed showing mild emphysema  Assessment:  Shortness of breath -Likely related to deconditioning, mild emphysema on CT -Importance of quitting smoking reiterated  Abnormal CT showing emphysema -Encouraged to quit smoking  Obstructive sleep apnea -Follows up with Dr. Vickey Huger -Feels well controlled  Obesity  Plan/Recommendations:  Continue graded exercise as tolerated  Follow-up low-dose CT  Encouraged to call with any significant concerns  Follow-up for obstructive sleep apnea   Virl Diamond MD [facility] 09/08/2022, 11:29 AM  CC: Fatima Sanger, FNP

## 2022-09-24 ENCOUNTER — Other Ambulatory Visit: Payer: Self-pay | Admitting: Gastroenterology

## 2022-09-24 DIAGNOSIS — R1084 Generalized abdominal pain: Secondary | ICD-10-CM

## 2022-10-14 ENCOUNTER — Other Ambulatory Visit: Payer: Self-pay | Admitting: Registered Nurse

## 2022-10-14 DIAGNOSIS — F17200 Nicotine dependence, unspecified, uncomplicated: Secondary | ICD-10-CM

## 2022-11-19 ENCOUNTER — Other Ambulatory Visit: Payer: Managed Care, Other (non HMO)

## 2022-12-07 ENCOUNTER — Other Ambulatory Visit: Payer: Self-pay

## 2022-12-07 ENCOUNTER — Emergency Department (HOSPITAL_BASED_OUTPATIENT_CLINIC_OR_DEPARTMENT_OTHER)
Admission: EM | Admit: 2022-12-07 | Discharge: 2022-12-07 | Disposition: A | Discharge status: Home / Self Care | Payer: Managed Care, Other (non HMO) | Attending: Emergency Medicine | Admitting: Emergency Medicine

## 2022-12-07 DIAGNOSIS — R109 Unspecified abdominal pain: Secondary | ICD-10-CM | POA: Diagnosis present

## 2022-12-07 DIAGNOSIS — R1084 Generalized abdominal pain: Secondary | ICD-10-CM

## 2022-12-07 DIAGNOSIS — Z7982 Long term (current) use of aspirin: Secondary | ICD-10-CM | POA: Insufficient documentation

## 2022-12-07 DIAGNOSIS — Z8616 Personal history of COVID-19: Secondary | ICD-10-CM | POA: Insufficient documentation

## 2022-12-07 LAB — LIPASE, BLOOD: Lipase: <10 U/L — ABNORMAL LOW (ref 11–51)

## 2022-12-07 LAB — URINALYSIS, ROUTINE W REFLEX MICROSCOPIC
Bilirubin Urine: NEGATIVE
Glucose, UA: NEGATIVE mg/dL
Hgb urine dipstick: NEGATIVE
Ketones, ur: NEGATIVE mg/dL
Nitrite: NEGATIVE
Protein, ur: NEGATIVE mg/dL
Specific Gravity, Urine: 1.009 (ref 1.005–1.030)
pH: 5.5 (ref 5.0–8.0)

## 2022-12-07 LAB — COMPREHENSIVE METABOLIC PANEL
ALT: 30 U/L (ref 0–44)
AST: 24 U/L (ref 15–41)
Albumin: 4.1 g/dL (ref 3.5–5.0)
Alkaline Phosphatase: 55 U/L (ref 38–126)
Anion gap: 10 (ref 5–15)
BUN: 10 mg/dL (ref 8–23)
CO2: 22 mmol/L (ref 22–32)
Calcium: 9.3 mg/dL (ref 8.9–10.3)
Chloride: 107 mmol/L (ref 98–111)
Creatinine, Ser: 0.8 mg/dL (ref 0.44–1.00)
GFR, Estimated: >60 mL/min (ref >60)
Glucose, Bld: 91 mg/dL (ref 70–99)
Potassium: 4.2 mmol/L (ref 3.5–5.1)
Sodium: 139 mmol/L (ref 135–145)
Total Bilirubin: 0.9 mg/dL (ref 0.3–1.2)
Total Protein: 7.1 g/dL (ref 6.5–8.1)

## 2022-12-07 LAB — CBC
HCT: 39.9 % (ref 36.0–46.0)
Hemoglobin: 12.5 g/dL (ref 12.0–15.0)
MCH: 27.7 pg (ref 26.0–34.0)
MCHC: 31.3 g/dL (ref 30.0–36.0)
MCV: 88.3 fL (ref 80.0–100.0)
Platelets: 435 10*3/uL — ABNORMAL HIGH (ref 150–400)
RBC: 4.52 MIL/uL (ref 3.87–5.11)
RDW: 13.8 % (ref 11.5–15.5)
WBC: 8.9 10*3/uL (ref 4.0–10.5)
nRBC: 0 % (ref 0.0–0.2)

## 2022-12-07 MED ORDER — SODIUM CHLORIDE 0.9 % IV BOLUS: 1000.0000 mL | Freq: Once | INTRAVENOUS | Status: DC

## 2022-12-07 NOTE — ED Provider Notes (Signed)
[facility] Provider Note   CSN: [id number] Arrival date & time: 12/07/22  1024     History  Chief Complaint  Patient presents with   Abdominal Pain    Danielle Mcdonald is a 67 y.o. female.  67 yo F with a chief complaint of abdominal pain.  This is all over been going on for couple days.  Worse with deep breathing.  She is just getting over COVID.  Has not been eating and drinking that well.  Denies fevers denies vomiting denies diarrhea.  Has been able to eat and drink without worsening her symptoms.   Abdominal Pain      Home Medications Prior to Admission medications   Medication Sig Start Date End Date Taking? Authorizing Provider  acetaminophen (TYLENOL) 500 MG tablet Take 500 mg by mouth every 6 (six) hours as needed.    [provider]  aspirin EC 81 MG tablet Take 81 mg by mouth daily. Swallow whole.    [provider]  Cholecalciferol (VITAMIN D3 PO) Take 1,000 Units by mouth daily.    [provider]  cyclobenzaprine (FLEXERIL) 5 MG tablet Take 5 mg by mouth daily as needed. 02/13/20   [provider]  famotidine (PEPCID) 20 MG tablet TAKE 1 TABLET BY MOUTH TWICE A DAY 09/24/22   Thornton Park, MD  ibuprofen (ADVIL) 400 MG tablet Take 400 mg by mouth every 6 (six) hours as needed.    [provider]  lisinopril (ZESTRIL) 20 MG tablet Take 20 mg by mouth daily.    [provider]  Multiple Vitamins-Minerals (WOMENS MULTIVITAMIN PO) Take by mouth.    [provider]  pravastatin (PRAVACHOL) 40 MG tablet pravastatin 40 mg tablet    [provider]      Allergies    Lisinopril and Sulfa antibiotics    Review of Systems   Review of Systems  Gastrointestinal:  Positive for abdominal pain.    Physical Exam Updated Vital Signs BP 124/85 (BP Location: Right Arm)   Pulse 86   Temp 98 F (36.7 C)   Resp 18   Ht 5\' 3"  (1.6 m)   Wt 93.9 kg   SpO2  99%   BMI 36.67 kg/m  Physical Exam Vitals and nursing note reviewed.  Constitutional:      General: She is not in acute distress.    Appearance: She is well-developed. She is not diaphoretic.  HENT:     Head: Normocephalic and atraumatic.  Eyes:     Pupils: Pupils are equal, round, and reactive to light.  Cardiovascular:     Rate and Rhythm: Normal rate and regular rhythm.     Heart sounds: No murmur heard.    No friction rub. No gallop.  Pulmonary:     Effort: Pulmonary effort is normal.     Breath sounds: No wheezing or rales.  Abdominal:     General: There is no distension.     Palpations: Abdomen is soft.     Tenderness: There is generalized abdominal tenderness.     Comments: Pain diffusely about the abdomen.  Mild.  Musculoskeletal:        General: No tenderness.     Cervical back: Normal range of motion and neck supple.  Skin:    General: Skin is warm and dry.  Neurological:     Mental Status: She is alert and oriented to person, place, and time.  Psychiatric:  Behavior: Behavior normal.     ED Results / Procedures / Treatments   Labs (all labs ordered are listed, but only abnormal results are displayed) Labs Reviewed  LIPASE, BLOOD - Abnormal; Notable for the following components:      Result Value   Lipase <10 (*)    All other components within normal limits  CBC - Abnormal; Notable for the following components:   Platelets 435 (*)    All other components within normal limits  URINALYSIS, ROUTINE W REFLEX MICROSCOPIC - Abnormal; Notable for the following components:   Leukocytes,Ua TRACE (*)    Bacteria, UA RARE (*)    All other components within normal limits  COMPREHENSIVE METABOLIC PANEL    EKG None  Radiology No results found.  Procedures Procedures    Medications Ordered in ED Medications  sodium chloride 0.9 % bolus 1,000 mL (1,000 mLs Intravenous Patient Refused/Not Given 12/07/22 1303)    ED Course/ Medical Decision Making/  A&P                             Medical Decision Making Amount and/or Complexity of Data Reviewed Labs: ordered.   67 yo F with a chief complaint of abdominal pain.  This been going on for couple days.  She is well-appearing nontoxic.  Benign exam.  Lab work is largely unremarkable.  No anemia, no electrolyte abnormality, LFTs lipase are unremarkable.  UA negative for infection is independently interpreted by me.  Will treat supportively.  PCP follow-up.  1:05 PM:  I have discussed the diagnosis/risks/treatment options with the patient.  Evaluation and diagnostic testing in the emergency department does not suggest an emergent condition requiring admission or immediate intervention beyond what has been performed at this time.  They will follow up with PCP. We also discussed returning to the ED immediately if new or worsening sx occur. We discussed the sx which are most concerning (e.g., sudden worsening pain, fever, inability to tolerate by mouth) that necessitate immediate return. Medications administered to the patient during their visit and any new prescriptions provided to the patient are listed below.  Medications given during this visit Medications  sodium chloride 0.9 % bolus 1,000 mL (1,000 mLs Intravenous Patient Refused/Not Given 12/07/22 1303)     The patient appears reasonably screen and/or stabilized for discharge and I doubt any other medical condition or other [facility] requiring further screening, evaluation, or treatment in the ED at this time prior to discharge.          Final Clinical Impression(s) / ED Diagnoses Final diagnoses:  Generalized abdominal pain    Rx / DC Orders ED Discharge Orders     None         Deno Etienne, DO 12/07/22 1305

## 2022-12-07 NOTE — Discharge Instructions (Signed)
Take 4 over the counter ibuprofen tablets 3 times a day or 2 over-the-counter naproxen tablets twice a day for pain. Also take tylenol 1000mg (2 extra strength) four times a day.    Follow up with your doctor in the office.  Return for worsening pain pain that goes to 1 spot, fever or inability eat or drink.

## 2022-12-11 ENCOUNTER — Ambulatory Visit
Admission: RE | Admit: 2022-12-11 | Discharge: 2022-12-11 | Disposition: A | Discharge status: Home / Self Care | Payer: Managed Care, Other (non HMO) | Source: Ambulatory Visit | Attending: Registered Nurse | Admitting: Registered Nurse

## 2022-12-11 DIAGNOSIS — F17200 Nicotine dependence, unspecified, uncomplicated: Secondary | ICD-10-CM

## 2023-03-12 ENCOUNTER — Other Ambulatory Visit: Payer: Self-pay | Admitting: Registered Nurse

## 2023-03-12 DIAGNOSIS — Z1231 Encounter for screening mammogram for malignant neoplasm of breast: Secondary | ICD-10-CM

## 2023-04-02 IMAGING — CT CT CHEST LUNG CANCER SCREENING LOW DOSE W/O CM
2 of 5 series · 15 of 40 positions shown, 18 images · non-contrast
Comparison: None.

CLINICAL DATA: Current smoker, 23 pack-year history.

EXAM:
CT CHEST WITHOUT CONTRAST LOW-DOSE FOR LUNG CANCER SCREENING
TECHNIQUE: Multidetector CT imaging of the chest was performed following the
standard protocol without IV contrast.

[Series 4: lung 1.00 br44 cor · coronal · 0.55mm/px · 3 of 421 slices shown]
[im 85/421  lung]
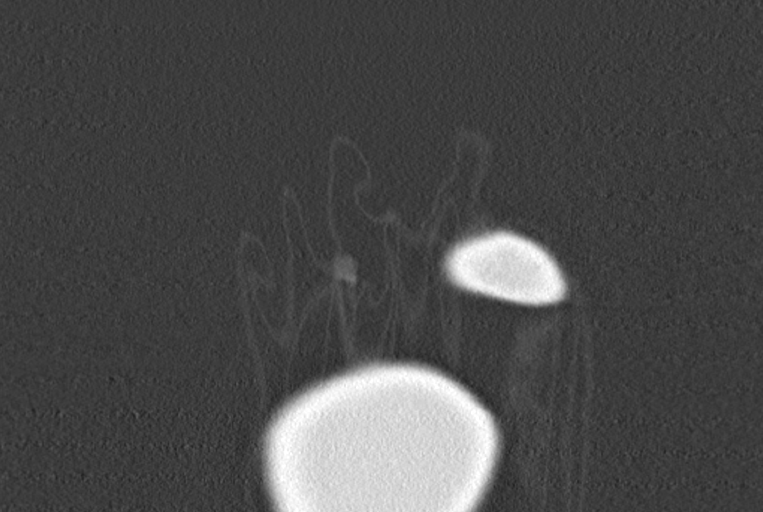
[im 169/421  lung]
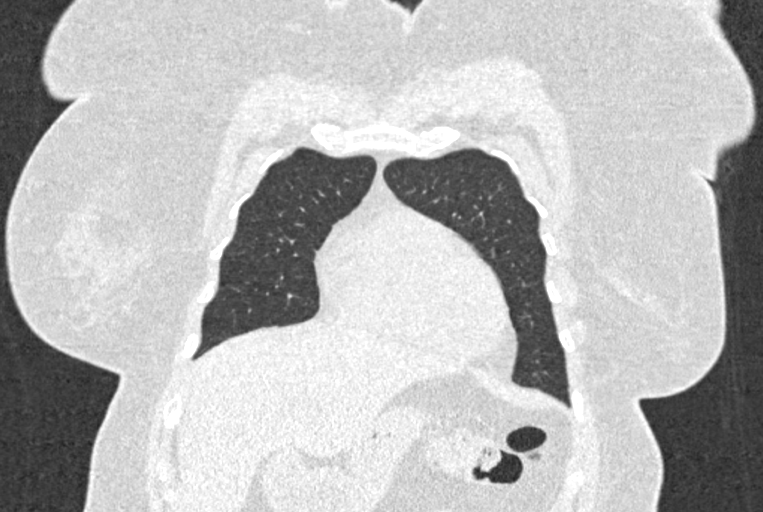
[im 253/421  lung]
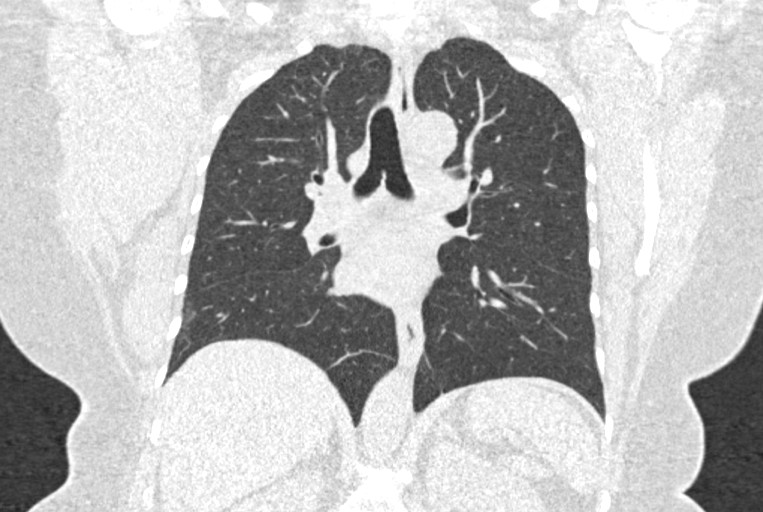

[Series 9: lung 1.00 br60 axial · axial · 0.83mm/px · z∈[-1069,-812]mm · 12 of 283 slices shown, 15 images]
[im 13/283  mediastinal]
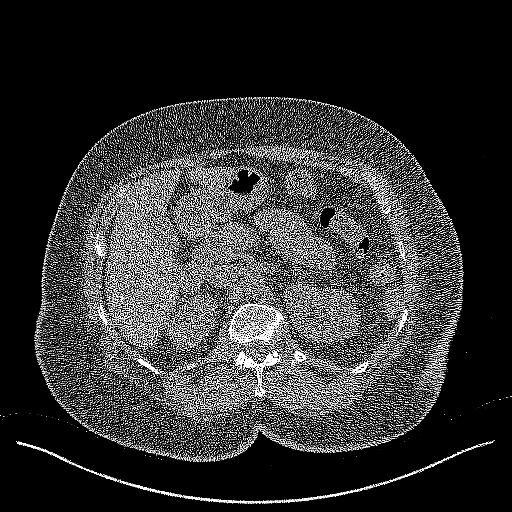
[im 13/283  lung]
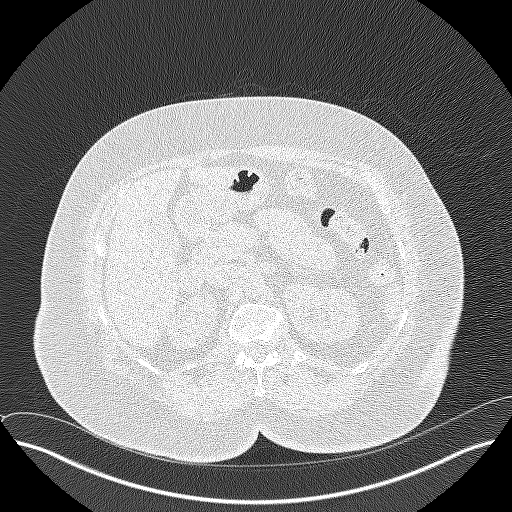
[im 39/283  lung]
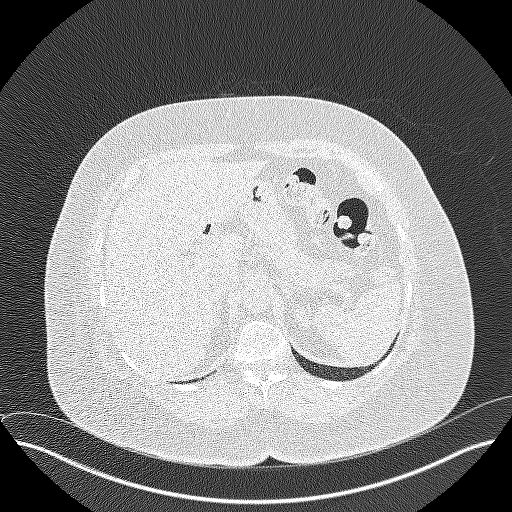
[im 65/283  lung]
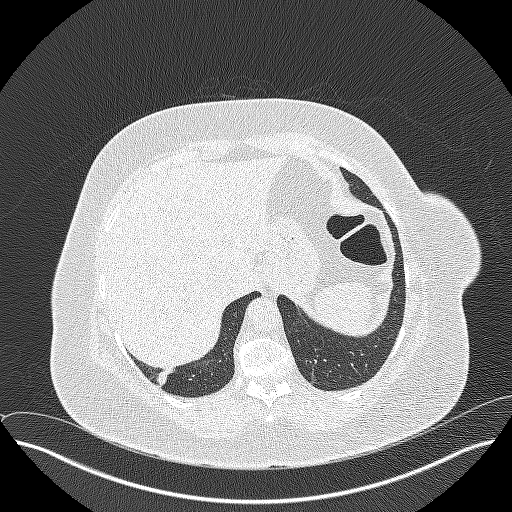
[im 90/283  lung]
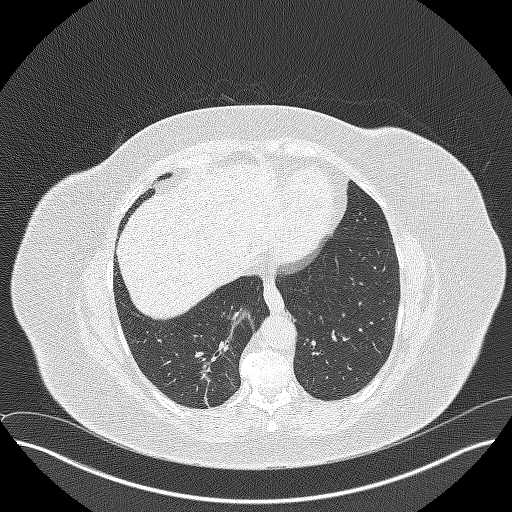
[im 103/283  mediastinal]
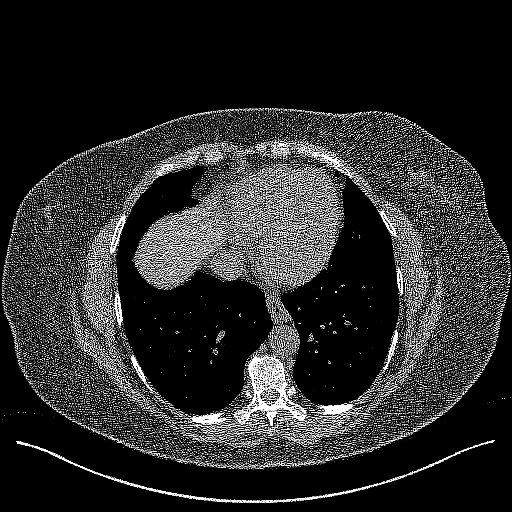
[im 103/283  lung]
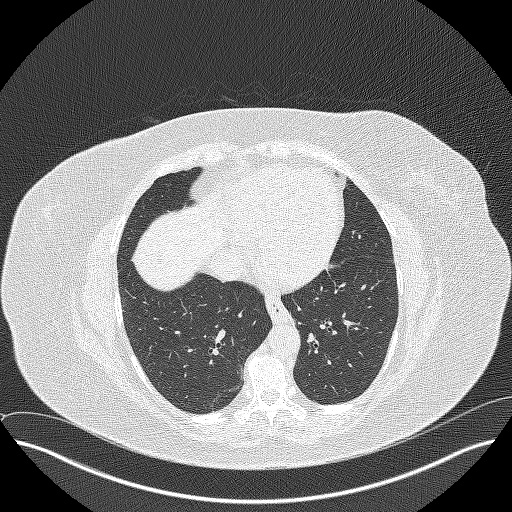
[im 129/283  lung]
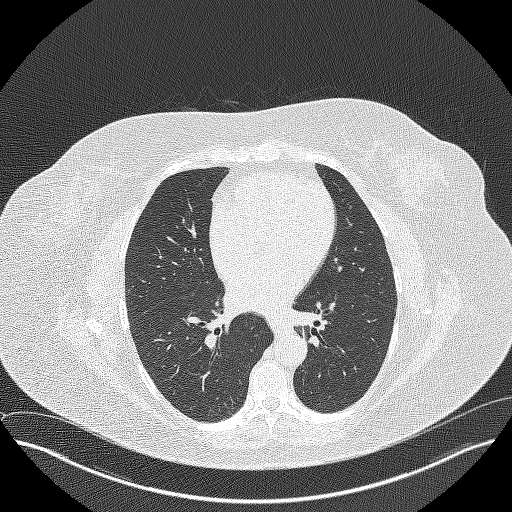
[im 154/283  lung]
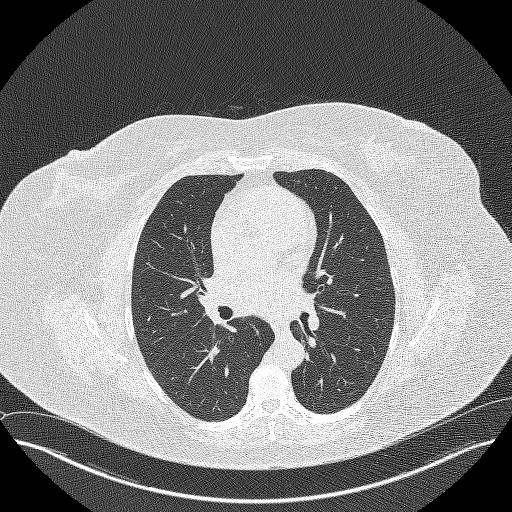
[im 180/283  lung]
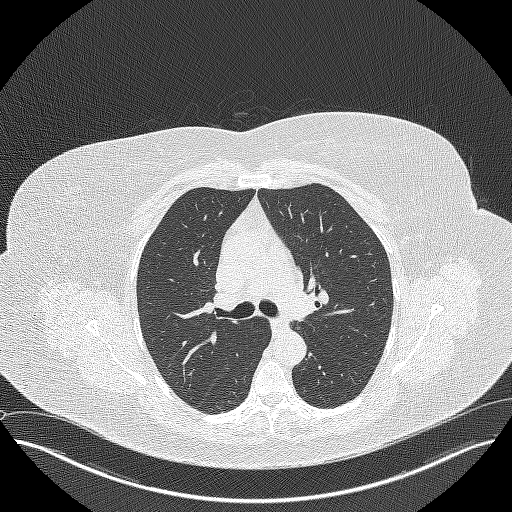
[im 193/283  mediastinal]
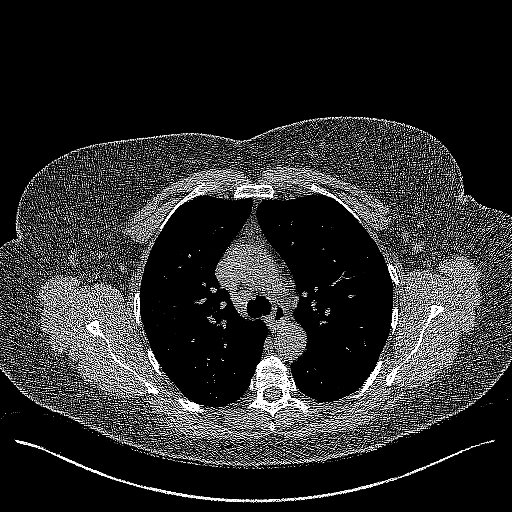
[im 193/283  lung]
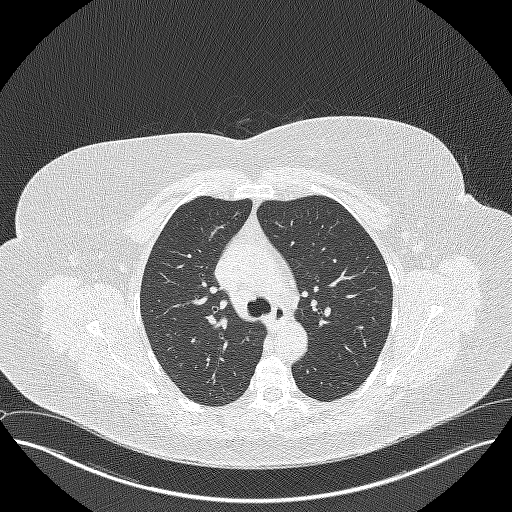
[im 218/283  lung]
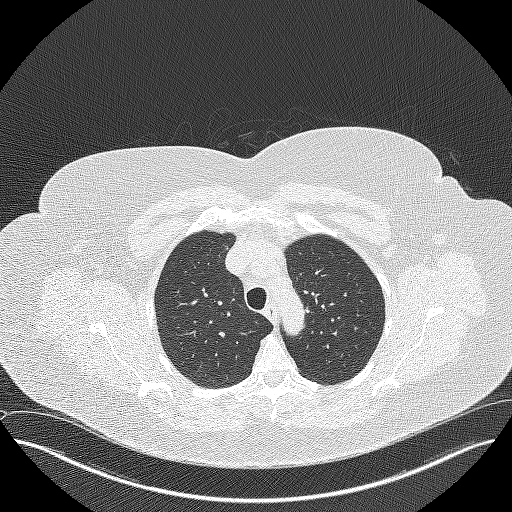
[im 244/283  lung]
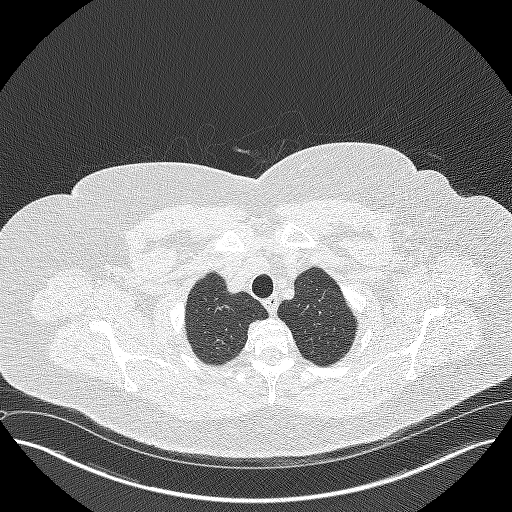
[im 270/283  lung]
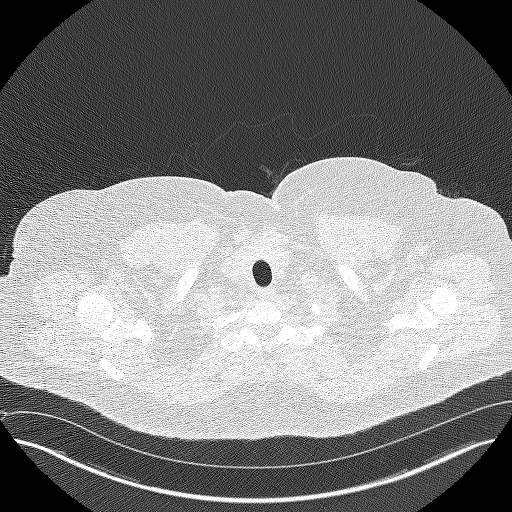

[15 of 40 positions shown; findings below may reference images not displayed]

FINDINGS: Cardiovascular: Atherosclerotic calcification of the aorta. Heart is
at the upper limits of normal in size. No pericardial effusion.

Mediastinum/Nodes: Probable thymic tissue in the prevascular space.
No pathologically enlarged mediastinal or axillary lymph nodes.
Hilar regions are difficult to evaluate without IV contrast.
Esophagus is grossly unremarkable.

Lungs/Pleura: Centrilobular and paraseptal emphysema. Scattered
pulmonary parenchymal scarring. No suspicious pulmonary nodules. No
pleural fluid. Airway is unremarkable.

Upper Abdomen: Visualized portions of the liver, gallbladder,
adrenal glands, kidneys, spleen, pancreas, stomach and bowel are
grossly unremarkable.

Musculoskeletal: Degenerative changes in the spine. No worrisome
lytic or sclerotic lesions.
IMPRESSION: 1. Lung-RADS 1, negative. Continue annual screening with low-dose
chest CT without contrast in 12 months.
2.  Aortic atherosclerosis ([id number]-47V.V).
3.  Emphysema (FOMYF-8WE.0).

## 2023-04-02 IMAGING — CT CT CARDIAC CORONARY ARTERY CALCIUM SCORE
3 series · 14 of 20 positions shown, 16 images · non-contrast
Comparison: CT lung cancer screening 04/24/2021

CLINICAL DATA: 64-year-old black female with elevated cholesterol.

EXAM:
CT CARDIAC CORONARY ARTERY CALCIUM SCORE
TECHNIQUE: Non-contrast imaging through the heart was performed using
prospective ECG gating. Image post processing was performed on an
independent workstation, allowing for quantitative analysis of the
heart and coronary arteries. Note that this exam targets the heart
and the chest was not imaged in its entirety.

[Series 2: calcium scoring 2.00 qr36 bestdiast 68% hrt calciu · axial · 0.36mm/px · z∈[-982,-918]mm · 4 of 54 slices shown]
[im 11/54  vessel]
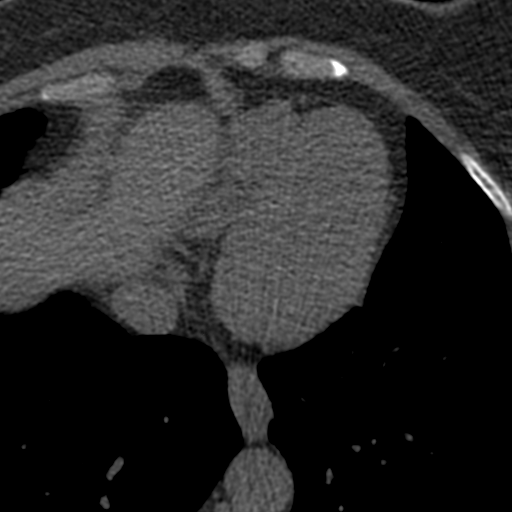
[im 22/54  vessel]
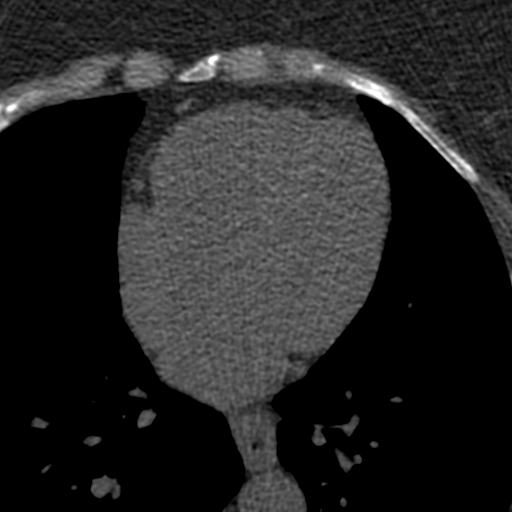
[im 32/54  vessel]
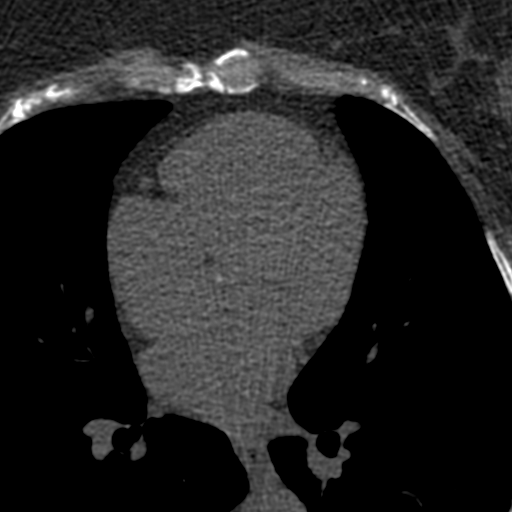
[im 43/54  vessel]
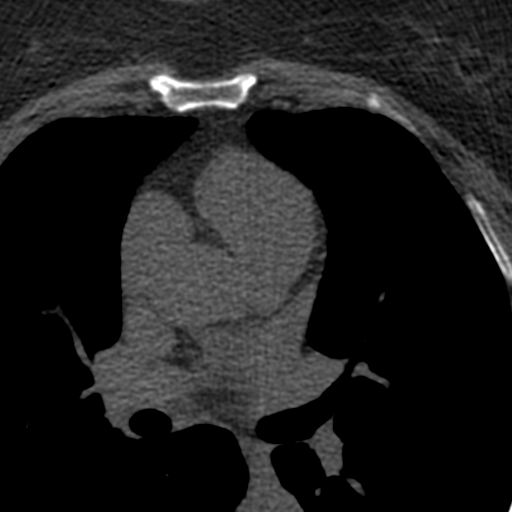

[Series 3: calcium scoring 2.00 br40 bestdiast 68% axial · axial · 0.75mm/px · z∈[-986,-914]mm · 5 of 54 slices shown, 7 images]
[im 9/54  vessel]
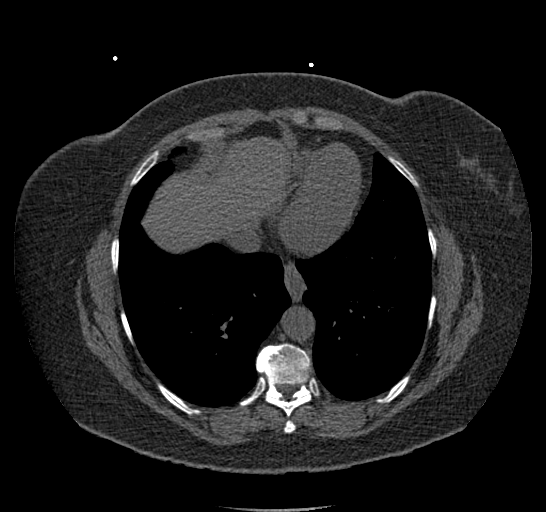
[im 9/54  lung]
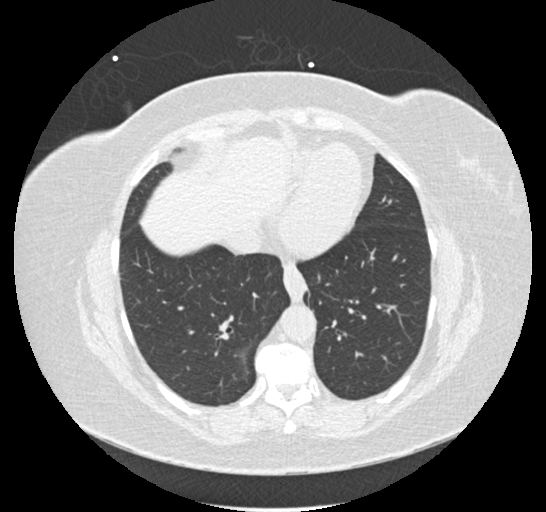
[im 18/54  vessel]
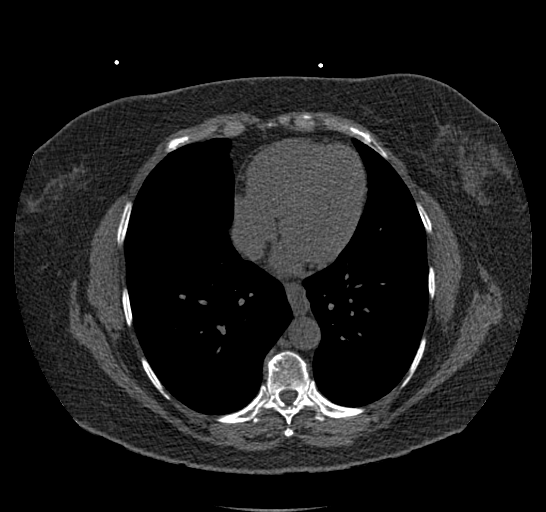
[im 27/54  vessel]
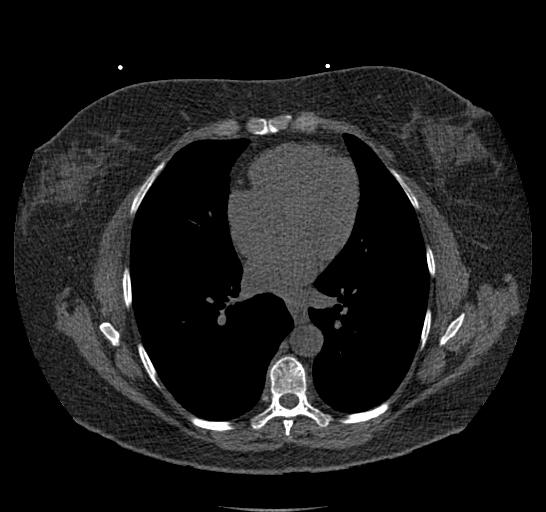
[im 36/54  vessel]
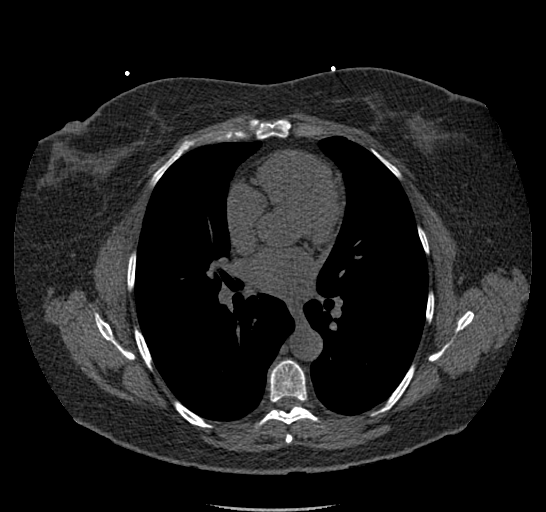
[im 45/54  vessel]
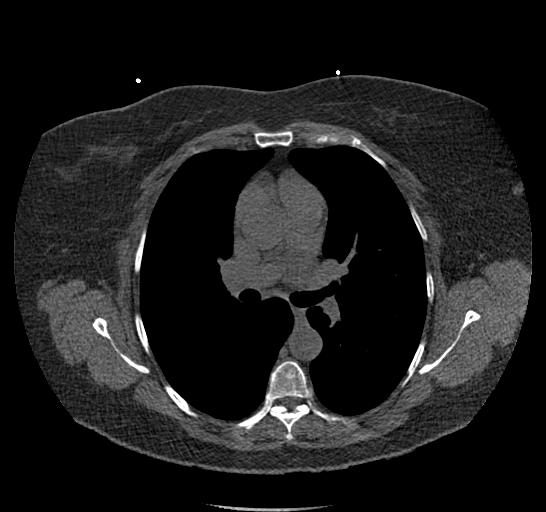
[im 45/54  lung]
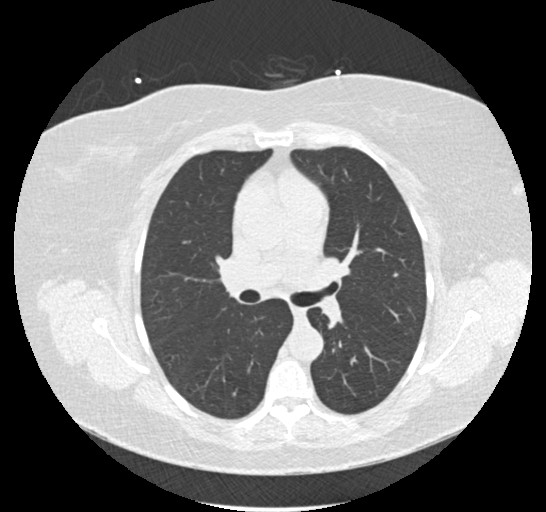

[Series 9: calcium scoring 2.00 br60 bestdiast 68% lungs · axial · 0.75mm/px · z∈[-986,-914]mm · 5 of 54 slices shown]
[im 9/54  vessel]
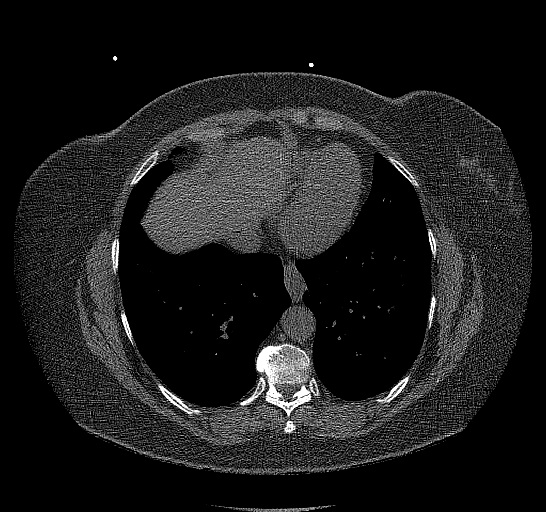
[im 18/54  vessel]
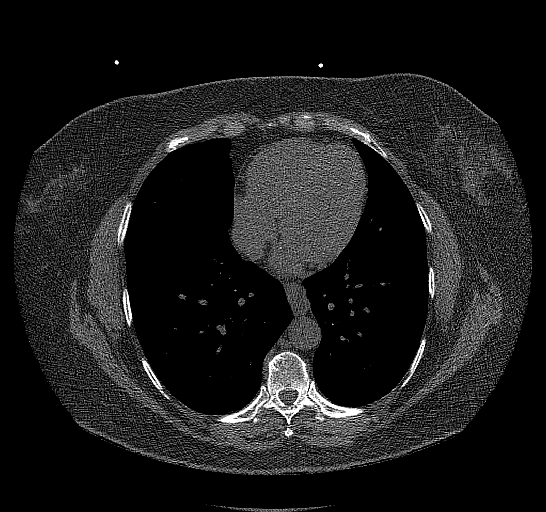
[im 27/54  vessel]
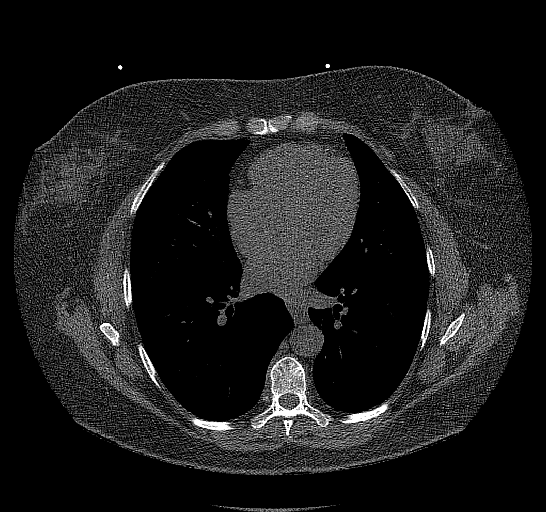
[im 36/54  vessel]
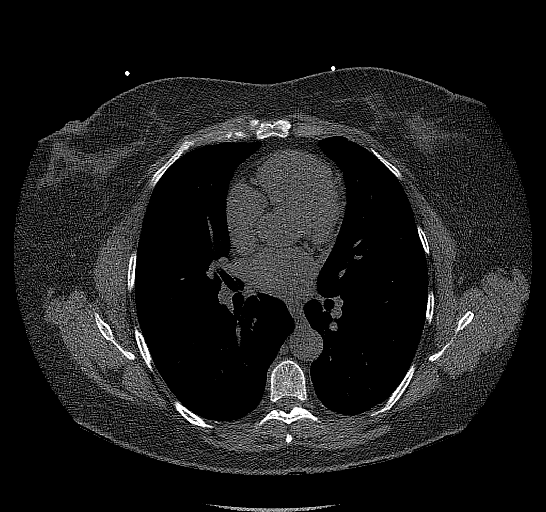
[im 45/54  vessel]
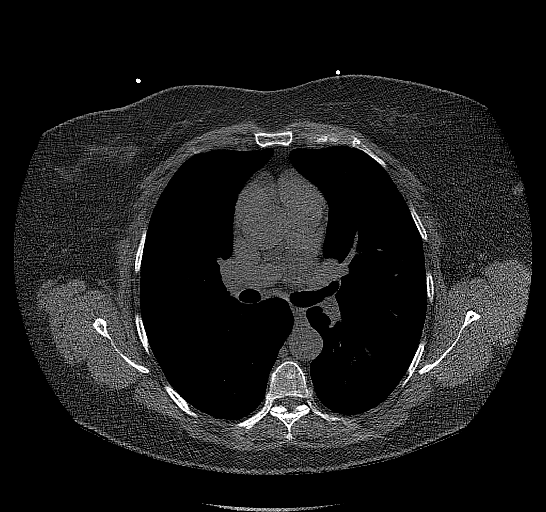

[14 of 20 positions shown; findings below may reference images not displayed]

FINDINGS: CORONARY CALCIUM SCORES:

Left Main: 0

LAD: 0

LCx: 0

RCA: 0

Total Agatston Score: 0

[HOSPITAL] percentile: 0

AORTA MEASUREMENTS:

Ascending Aorta: 30 mm

Descending Aorta: 26 mm

OTHER FINDINGS:

Small amount of tissue in the anterior mediastinum is most
compatible with thymus. Origin of the right coronary artery is
poorly characterized on this examination. Heart size is normal. No
significant pericardial fluid. Small left axillary lymph nodes.
Visualized upper abdomen is unremarkable. 2 mm nodule in the right
upper lobe on sequence 9 image 5. Linear densities at the right lung
base could represent atelectasis or scarring. No significant
airspace disease or consolidation in the visualized lungs. No acute
bone abnormality.
IMPRESSION: 1. Coronary calcium score is 0.
2. Tiny nodule in the right upper lung. This may be calcified based
on the CT lung cancer screening exam and recommend correlation with
that report.

## 2023-04-28 ENCOUNTER — Ambulatory Visit
Admission: RE | Admit: 2023-04-28 | Discharge: 2023-04-28 | Disposition: A | Discharge status: Home / Self Care | Payer: Managed Care, Other (non HMO) | Source: Ambulatory Visit | Attending: Registered Nurse | Admitting: Registered Nurse

## 2023-04-28 ENCOUNTER — Ambulatory Visit: Payer: Managed Care, Other (non HMO)

## 2023-04-28 DIAGNOSIS — Z1231 Encounter for screening mammogram for malignant neoplasm of breast: Secondary | ICD-10-CM

## 2023-05-21 ENCOUNTER — Other Ambulatory Visit: Payer: Managed Care, Other (non HMO)

## 2023-05-21 ENCOUNTER — Ambulatory Visit: Payer: Managed Care, Other (non HMO) | Admitting: Cardiology

## 2023-05-21 ENCOUNTER — Encounter: Payer: Self-pay | Admitting: Cardiology

## 2023-05-21 VITALS — BP 127/84 | HR 85 | Resp 16 | Ht 63.0 in | Wt 183.0 lb

## 2023-05-21 DIAGNOSIS — I499 Cardiac arrhythmia, unspecified: Secondary | ICD-10-CM

## 2023-05-21 DIAGNOSIS — R9431 Abnormal electrocardiogram [ECG] [EKG]: Secondary | ICD-10-CM

## 2023-05-21 DIAGNOSIS — R002 Palpitations: Secondary | ICD-10-CM | POA: Insufficient documentation

## 2023-05-21 NOTE — Progress Notes (Signed)
Patient referred by Fatima Sanger, FNP for palpitations  Subjective:   Danielle Mcdonald, female    DOB: 1956-01-26, 67 y.o.   MRN: [id number]   Chief Complaint  Patient presents with   Palpitations   New Patient (Initial Visit)    HPI  67 y.o. African American female with hypertension, OSA, referred for palpitations  Patient is retired, takes care of her 31 month old grandchild. She has had complaints of palpitations, lasting for a few min at a time. She denies chest pain, shortness of breath, palpitations, leg edema, orthopnea, PND, TIA/syncope.   Past Medical History:  Diagnosis Date   Asthma    Digestive disorder    GERD (gastroesophageal reflux disease)    High cholesterol    Hypertension    Joint pain    Lactose intolerance    Sleep apnea      Past Surgical History:  Procedure Laterality Date   ESOPHAGOGASTRODUODENOSCOPY     TONSILLECTOMY     TUBAL LIGATION       Social History   Tobacco Use  Smoking Status Some Days   Current packs/day: 0.50   Average packs/day: 0.5 packs/day for 3.5 years (1.8 ttl pk-yrs)   Types: Cigarettes   Start date: 2021  Smokeless Tobacco Never  Tobacco Comments   Socially smoke 1-2 cigs.     Social History   Substance and Sexual Activity  Alcohol Use Not Currently     Family History  Problem Relation Age of Onset   Thyroid disease Mother    Diabetes Mother    Osteoarthritis Mother    Hypertension Mother    Heart disease Mother    Obesity Mother    Lung cancer Father    Alcoholism Father    Breast cancer Sister 29   Stroke Sister    Stroke Brother    Colon cancer Maternal Grandfather    Stomach cancer Neg Hx    Rectal cancer Neg Hx    Esophageal cancer Neg Hx    Liver cancer Neg Hx    Pancreatic cancer Neg Hx       Current Outpatient Medications:    acetaminophen (TYLENOL) 500 MG tablet, Take 500 mg by mouth every 6 (six) hours as needed., Disp: , Rfl:    aspirin EC 81 MG tablet, Take 81 mg  by mouth daily. Swallow whole., Disp: , Rfl:    Cholecalciferol (VITAMIN D3 PO), Take 1,000 Units by mouth daily., Disp: , Rfl:    famotidine (PEPCID) 20 MG tablet, TAKE 1 TABLET BY MOUTH TWICE A DAY, Disp: 180 tablet, Rfl: 1   ibuprofen (ADVIL) 400 MG tablet, Take 400 mg by mouth every 6 (six) hours as needed., Disp: , Rfl:    lisinopril (ZESTRIL) 20 MG tablet, Take 20 mg by mouth daily., Disp: , Rfl:    Multiple Vitamins-Minerals (WOMENS MULTIVITAMIN PO), Take by mouth., Disp: , Rfl:    pravastatin (PRAVACHOL) 40 MG tablet, pravastatin 40 mg tablet, Disp: , Rfl:    Cardiovascular and other pertinent studies:  Reviewed external labs and tests, independently interpreted  EKG 05/21/2023: Sinus rhythm 81 bpm Borderline left atrial enlargement   Recent labs: 12/07/2022: Glucose 91, BUN/Cr 10/0.8. EGFR >60. Na/K 139/4.2. Rest of the CMP normal H/H 12/39. MCV 88. Platelets 435  2021: HbA1C 5.3% Chol 175, TG 95, HDL 39, LDL 118 TSH 1.5 normal   Review of Systems  Cardiovascular:  Positive for palpitations. Negative for chest pain, dyspnea on exertion, leg  swelling and syncope.         Vitals:   05/21/23 1303  BP: 127/84  Pulse: 85  Resp: 16  SpO2: 96%     Body mass index is 32.42 kg/m. Filed Weights   05/21/23 1303  Weight: 183 lb (83 kg)     Objective:   Physical Exam Vitals and nursing note reviewed.  Constitutional:      General: She is not in acute distress. Neck:     Vascular: No JVD.  Cardiovascular:     Rate and Rhythm: Normal rate and regular rhythm.     Heart sounds: Normal heart sounds. No murmur heard. Pulmonary:     Effort: Pulmonary effort is normal.     Breath sounds: Normal breath sounds. No wheezing or rales.  Musculoskeletal:     Right lower leg: No edema.     Left lower leg: No edema.           Visit diagnoses:   ICD-10-CM   1. Palpitations  R00.2 EKG 12-Lead    PCV ECHOCARDIOGRAM COMPLETE    LONG TERM MONITOR (3-14 DAYS)     2. Abnormal EKG  R94.31 PCV ECHOCARDIOGRAM COMPLETE    LONG TERM MONITOR (3-14 DAYS)    3. Irregular heart beat  I49.9 PCV ECHOCARDIOGRAM COMPLETE    LONG TERM MONITOR (3-14 DAYS)       Orders Placed This Encounter  Procedures   EKG 12-Lead      Assessment & Recommendations:    67 y.o. African American female with hypertension, OSA, referred for palpitations  Palpitations: Normal physical exam, borderline left atrial enlargement on EKG.  Risk factors for Afib. Recommend echocardiogram, 2 week cardiac telemetry.  Further recommendations after above testing.    Thank you for referring the patient to [place]. Please feel free to contact with any questions.   Elder Negus, MD Pager: [phone] Office: [phone]

## 2023-06-11 ENCOUNTER — Other Ambulatory Visit: Payer: Managed Care, Other (non HMO)

## 2023-06-19 ENCOUNTER — Other Ambulatory Visit: Payer: Self-pay | Admitting: Gastroenterology

## 2023-06-19 DIAGNOSIS — R1084 Generalized abdominal pain: Secondary | ICD-10-CM

## 2023-06-23 ENCOUNTER — Ambulatory Visit: Payer: Managed Care, Other (non HMO)

## 2023-06-23 DIAGNOSIS — R9431 Abnormal electrocardiogram [ECG] [EKG]: Secondary | ICD-10-CM

## 2023-06-23 DIAGNOSIS — I499 Cardiac arrhythmia, unspecified: Secondary | ICD-10-CM

## 2023-06-23 DIAGNOSIS — R002 Palpitations: Secondary | ICD-10-CM

## 2023-07-02 ENCOUNTER — Encounter: Payer: Self-pay | Admitting: Cardiology

## 2023-07-02 ENCOUNTER — Ambulatory Visit: Payer: Medicare Other | Admitting: Cardiology

## 2023-07-02 VITALS — BP 144/85 | HR 65 | Resp 15 | Ht 63.0 in | Wt 185.0 lb

## 2023-07-02 DIAGNOSIS — R002 Palpitations: Secondary | ICD-10-CM

## 2023-07-02 DIAGNOSIS — R9431 Abnormal electrocardiogram [ECG] [EKG]: Secondary | ICD-10-CM

## 2023-07-02 DIAGNOSIS — G4733 Obstructive sleep apnea (adult) (pediatric): Secondary | ICD-10-CM

## 2023-07-02 NOTE — Progress Notes (Signed)
Patient referred by Fatima Sanger, FNP for palpitations  Subjective:   Danielle Mcdonald, female    DOB: [birth year], 67 y.o.   MRN: [id number]   Chief Complaint  Patient presents with   Palpitations    HPI  67 y.o. African American female with hypertension, OSA, referred for palpitations  No palpitations since last visit. Reviewed recent test results with the patient, details below.    Initial consultation visit 05/2023: Patient is retired, takes care of her 9 month old grandchild. She has had complaints of palpitations, lasting for a few min at a time. She denies chest pain, shortness of breath, palpitations, leg edema, orthopnea, PND, TIA/syncope.    Current Outpatient Medications:    acetaminophen (TYLENOL) 500 MG tablet, Take 500 mg by mouth every 6 (six) hours as needed., Disp: , Rfl:    aspirin EC 81 MG tablet, Take 81 mg by mouth daily. Swallow whole., Disp: , Rfl:    Cholecalciferol (VITAMIN D3 PO), Take 1,000 Units by mouth daily., Disp: , Rfl:    famotidine (PEPCID) 20 MG tablet, TAKE 1 TABLET BY MOUTH TWICE A DAY, Disp: 180 tablet, Rfl: 1   ibuprofen (ADVIL) 400 MG tablet, Take 400 mg by mouth every 6 (six) hours as needed., Disp: , Rfl:    lisinopril (ZESTRIL) 20 MG tablet, Take 20 mg by mouth daily., Disp: , Rfl:    Multiple Vitamins-Minerals (WOMENS MULTIVITAMIN PO), Take by mouth., Disp: , Rfl:    pravastatin (PRAVACHOL) 40 MG tablet, pravastatin 40 mg tablet, Disp: , Rfl:    Cardiovascular and other pertinent studies:  Reviewed external labs and tests, independently interpreted  EKG 05/21/2023: Sinus rhythm 81 bpm Borderline left atrial enlargement  Echocardiogram 06/23/2023: Left ventricle cavity is normal in size. Normal left ventricular wall thickness. Normal global wall motion. Normal LV systolic function with EF 73%. Doppler evidence of grade I (impaired) diastolic dysfunction, normal LAP. Left atrial cavity is mildly dilated at 36.7 ml/m^2.  Aneurysmal interatrial septum without 2D or color Doppler evidence of shunting. Mild tricuspid regurgitation. Estimated pulmonary artery systolic pressure 29 mmHg. Compared to previous study on 05/07/2022, mild LA dilatation, aneurysmal interatrial septum, mild TR are new findings.  Mobile cardiac telemetry 11 days 05/21/2023 - 06/01/2023: Dominant rhythm: Sinus. HR 57-145 bpm. Avg HR 93 bpm. 2 episodes of atrial tachycardia, fastest at 138 bpm for 4 beats, longest for 4 beats at 97 bpm. <1% isolated SVE, couplets. 0 episodes of VT. <1% isolated VE, no couplet/triplets. No atrial fibrillation/atrial flutter/VT/high grade AV block, sinus pause >3sec noted. 1 patient triggered event correlated with VE.  CT cardiac scoring 04/2021: 1. Coronary calcium score is 0. 2. Tiny nodule in the right upper lung. This may be calcified based on the CT lung cancer screening exam and recommend correlation with that report.  Recent labs: 12/07/2022: Glucose 91, BUN/Cr 10/0.8. EGFR >60. Na/K 139/4.2. Rest of the CMP normal H/H 12/39. MCV 88. Platelets 435  2021: HbA1C 5.3% Chol 175, TG 95, HDL 39, LDL 118 TSH 1.5 normal   Review of Systems  Cardiovascular:  Positive for palpitations. Negative for chest pain, dyspnea on exertion, leg swelling and syncope.         Vitals:   07/02/23 1257  BP: (!) 144/85  Pulse: 65  Resp: 15  SpO2: 97%     Body mass index is 32.77 kg/m. Filed Weights   07/02/23 1257  Weight: 185 lb (83.9 kg)     Objective:  Physical Exam Vitals and nursing note reviewed.  Constitutional:      General: She is not in acute distress. Neck:     Vascular: No JVD.  Cardiovascular:     Rate and Rhythm: Normal rate and regular rhythm.     Heart sounds: Normal heart sounds. No murmur heard. Pulmonary:     Effort: Pulmonary effort is normal.     Breath sounds: Normal breath sounds. No wheezing or rales.  Musculoskeletal:     Right lower leg: No edema.     Left  lower leg: No edema.           Visit diagnoses:   ICD-10-CM   1. Palpitations  R00.2     2. Abnormal EKG  R94.31     3. OSA on CPAP  G47.33            Assessment & Recommendations:    67 y.o. African American female with hypertension, OSA, referred for palpitations  Palpitations: Improved. Normal physical exam, borderline left atrial enlargement on EKG and echcoardiogram.  Risk factors for Afib, but not seen on cardiac telemetry. Consider smart watch for longitudinal monitoring. Continue CPAP for OSA.  F/u as needed    Thank you for referring the patient to [place]. Please feel free to contact with any questions.   Elder Negus, MD Pager: [phone] Office: [phone]

## 2023-07-21 ENCOUNTER — Other Ambulatory Visit: Payer: Self-pay | Admitting: Obstetrics and Gynecology

## 2023-07-21 DIAGNOSIS — Z13820 Encounter for screening for osteoporosis: Secondary | ICD-10-CM

## 2023-12-23 ENCOUNTER — Other Ambulatory Visit: Payer: Self-pay | Admitting: Registered Nurse

## 2023-12-23 DIAGNOSIS — F17200 Nicotine dependence, unspecified, uncomplicated: Secondary | ICD-10-CM

## 2024-01-22 ENCOUNTER — Other Ambulatory Visit: Payer: Managed Care, Other (non HMO)

## 2024-01-26 ENCOUNTER — Ambulatory Visit
Admission: RE | Admit: 2024-01-26 | Discharge: 2024-01-26 | Disposition: A | Discharge status: Home / Self Care | Payer: Managed Care, Other (non HMO) | Source: Ambulatory Visit | Attending: Registered Nurse | Admitting: Registered Nurse

## 2024-01-26 DIAGNOSIS — F17200 Nicotine dependence, unspecified, uncomplicated: Secondary | ICD-10-CM

## 2024-02-17 ENCOUNTER — Ambulatory Visit
Admission: RE | Admit: 2024-02-17 | Discharge: 2024-02-17 | Disposition: A | Discharge status: Home / Self Care | Payer: Managed Care, Other (non HMO) | Source: Ambulatory Visit | Attending: Obstetrics and Gynecology | Admitting: Obstetrics and Gynecology

## 2024-02-17 DIAGNOSIS — Z13820 Encounter for screening for osteoporosis: Secondary | ICD-10-CM

## 2024-04-14 LAB — LAB REPORT - SCANNED: EGFR: 74

## 2024-04-19 ENCOUNTER — Other Ambulatory Visit: Payer: Self-pay | Admitting: Registered Nurse

## 2024-04-19 DIAGNOSIS — Z1231 Encounter for screening mammogram for malignant neoplasm of breast: Secondary | ICD-10-CM

## 2024-04-20 ENCOUNTER — Ambulatory Visit: Payer: Self-pay | Admitting: Cardiology

## 2024-04-20 NOTE — Progress Notes (Signed)
 I last saw the patient in 06/2019 for, and recommended as needed follow-up.  I received labs for this patient from PCP that show LDL of 154.  I see that she is currently on pravastatin 40 mg daily.  I would recommend switching pravastatin to rosuvastatin 20 mg daily, as patient is amenable.  He may convey these recommendations to patient's PCP Giles Labrum.  Thanks MJP

## 2024-05-03 ENCOUNTER — Ambulatory Visit
Admission: RE | Admit: 2024-05-03 | Discharge: 2024-05-03 | Disposition: A | Discharge status: Home / Self Care | Source: Ambulatory Visit | Attending: Registered Nurse | Admitting: Registered Nurse

## 2024-05-03 DIAGNOSIS — Z1231 Encounter for screening mammogram for malignant neoplasm of breast: Secondary | ICD-10-CM

## 2024-05-09 ENCOUNTER — Encounter: Payer: Self-pay | Admitting: Neurology

## 2024-05-17 NOTE — Progress Notes (Unsigned)
 Assessment/Plan:   Tremor, by history  - None seen on patient's examination today.  Nonetheless, I reassured her that I also saw nothing pathologic on her examination.  I send no evidence of a neurodegenerative process.  Today, I saw no evidence of essential tremor.  Technically, in order to have essential tremor 1 must have tremor for 3 years.  That does not, of course, mean that this could not be developing.  However, we both agreed that risks of medications would outweigh benefits at this point in time.  Tremor is just not that bothersome currently.  Certainly, she is to watch out for more bothersome or concerning features and we discussed those today.  I would be happy to see her back should any of those develop.  Otherwise, she will follow-up on an as-needed basis.   Subjective:   Danielle Mcdonald was seen in consultation in the movement disorder clinic at the request of Prevost, Mary Ellen, FNP.  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  Patient has previously seen [facility] neurology, Dr. Chalice, back in 2021 but that was primarily for sleep apnea.  Tremor started approximately few months ago.  Patients states that when she goes to pick up something, the L thumb may start to tremble and then the R hand may start to shake.  She also notes if she drinks a bottle of water with the left hand and tilts the head, the head will shake (not the hand).  She is R hand dominant.     There is no family hx of tremor.  Tremor doesn't prevent her from doing what she needs to do.  It doesn't matter how heavy the object is or how tired she is.    Affected by caffeine:  No. Affected by alcohol:  doesn't drink any Affected by stress:  No. Affected by fatigue:  No. Spills soup if on spoon:  n/a Spills glass of liquid if full:  No. Affects ADL's (tying shoes, brushing teeth, etc):  No.  Current/Previously tried tremor medications: n/a  Current medications that may exacerbate tremor:  None  Outside  reports reviewed: historical medical records, lab reports, and referral letter/letters.  Allergies  Allergen Reactions   Lisinopril     Other reaction(s): Sore on lips   Sulfa Antibiotics Hives    Oral per patient can tolerate IV.     Current Meds  Medication Sig   acetaminophen (TYLENOL) 500 MG tablet Take 500 mg by mouth every 6 (six) hours as needed.   Cholecalciferol (VITAMIN D3 PO) Take 1,000 Units by mouth daily.   famotidine  (PEPCID ) 20 MG tablet TAKE 1 TABLET BY MOUTH TWICE A DAY   ibuprofen (ADVIL) 400 MG tablet Take 400 mg by mouth every 6 (six) hours as needed.   pravastatin (PRAVACHOL) 40 MG tablet pravastatin 40 mg tablet   ZEPBOUND 2.5 MG/0.5ML Pen Inject 2.5 mg into the skin once a week.      Objective:   VITALS:   Vitals:   05/18/24 1018  BP: 122/88  Pulse: 60  SpO2: 96%  Weight: 199 lb (90.3 kg)   Gen:  Appears stated age and in NAD. HEENT:  Normocephalic, atraumatic. The mucous membranes are moist. The superficial temporal arteries are without ropiness or tenderness. Cardiovascular: Regular rate and rhythm. Lungs: Clear to auscultation bilaterally. Neck: There are no carotid bruits noted bilaterally.  NEUROLOGICAL:  Orientation:  The patient is alert and oriented x 3.   Cranial nerves: There is good facial  symmetry. Extraocular muscles are intact and visual fields are full to confrontational testing. Speech is fluent and clear. Soft palate rises symmetrically and there is no tongue deviation. Hearing is intact to conversational tone. Tone: Tone is good throughout. Sensation: Sensation is intact to light touch touch throughout (facial, trunk, extremities). Vibration is intact at the bilateral big toe. There is no extinction with double simultaneous stimulation. There is no sensory dermatomal level identified. Coordination:  The patient has no dysdiadichokinesia or dysmetria. Motor: Strength is 5/5 in the bilateral upper and lower extremities.  Shoulder  shrug is equal bilaterally.  There is no pronator drift.  There are no fasciculations noted. DTR's: Deep tendon reflexes are 2/4 at the bilateral biceps, triceps, brachioradialis, patella and achilles.  Plantar responses are downgoing bilaterally. Gait and Station: The patient is able to ambulate without difficulty.   The patient is able to stand in the Romberg position.   MOVEMENT EXAM: Tremor:  There is no tremor in the UE, noted most significantly with action.  The patient is  able to draw Archimedes spirals without significant difficulty.  There is no tremor at rest.  The patient is  able to pour water from one glass to another without spilling it.   I have reviewed and interpreted the following labs independently   Chemistry      Component Value Date/Time   NA 139 12/07/2022 1138   NA 140 05/17/2020 0810   K 4.2 12/07/2022 1138   CL 107 12/07/2022 1138   CO2 22 12/07/2022 1138   BUN 10 12/07/2022 1138   BUN 14 05/17/2020 0810   CREATININE 0.80 12/07/2022 1138   GLU 92 03/21/2020 0000      Component Value Date/Time   CALCIUM 9.3 12/07/2022 1138   ALKPHOS 55 12/07/2022 1138   AST 24 12/07/2022 1138   ALT 30 12/07/2022 1138   BILITOT 0.9 12/07/2022 1138   BILITOT 0.5 05/17/2020 0810      Lab Results  Component Value Date   WBC 8.9 12/07/2022   HGB 12.5 12/07/2022   HCT 39.9 12/07/2022   MCV 88.3 12/07/2022   PLT 435 (H) 12/07/2022   Lab Results  Component Value Date   TSH 1.550 05/17/2020   She had labs done more recently by primary care in April 14, 2024.  White blood cell 6.2, hemoglobin 12.5, hematocrit 38.9 and platelets 307.  Sodium is 143, potassium 4.5, chloride 106, CO2 21, BUN 14, creatinine 0.87, glucose 91, AST 21, ALT 17.  TSH was apparently ordered, but I did not get a copy of that.   Total time spent on today's visit was 45 minutes, including both face-to-face time and nonface-to-face time.  Time included that spent on review of records (prior notes  available to me/labs/imaging if pertinent), discussing treatment and goals, answering patient's questions and coordinating care.  CC:  Royden Ronal Czar, FNP

## 2024-05-18 ENCOUNTER — Encounter: Payer: Self-pay | Admitting: Neurology

## 2024-05-18 ENCOUNTER — Ambulatory Visit (INDEPENDENT_AMBULATORY_CARE_PROVIDER_SITE_OTHER): Admitting: Neurology

## 2024-05-18 VITALS — BP 122/88 | HR 60 | Ht 65.0 in | Wt 199.0 lb

## 2024-05-18 DIAGNOSIS — R251 Tremor, unspecified: Secondary | ICD-10-CM

## 2024-05-18 NOTE — Patient Instructions (Signed)
 I don't see anything today that is bothersome.  If tremor becomes worse or is more bothersome to you, then we would want you to follow up.  The physicians and staff at [facility] are committed to providing excellent care. You may receive a survey requesting feedback about your experience at our office. We strive to receive very good responses to the survey questions. If you feel that your experience would prevent you from giving the office a very good  response, please contact our office to try to remedy the situation. We may be reached at [phone]. Thank you for taking the time out of your busy day to complete the survey.

## 2024-06-13 ENCOUNTER — Other Ambulatory Visit: Payer: Self-pay | Admitting: *Deleted

## 2024-06-13 DIAGNOSIS — I83813 Varicose veins of bilateral lower extremities with pain: Secondary | ICD-10-CM

## 2024-06-24 ENCOUNTER — Encounter: Payer: Self-pay | Admitting: Gastroenterology

## 2024-07-08 ENCOUNTER — Ambulatory Visit: Admitting: Vascular Surgery

## 2024-07-08 ENCOUNTER — Ambulatory Visit (HOSPITAL_COMMUNITY)
Admission: RE | Admit: 2024-07-08 | Discharge: 2024-07-08 | Disposition: A | Discharge status: Home / Self Care | Source: Ambulatory Visit | Attending: Vascular Surgery | Admitting: Vascular Surgery

## 2024-07-08 ENCOUNTER — Encounter: Payer: Self-pay | Admitting: Vascular Surgery

## 2024-07-08 VITALS — BP 141/89 | HR 74 | Temp 98.0°F | Ht 65.0 in | Wt 187.0 lb

## 2024-07-08 DIAGNOSIS — I872 Venous insufficiency (chronic) (peripheral): Secondary | ICD-10-CM | POA: Diagnosis not present

## 2024-07-08 DIAGNOSIS — I8393 Asymptomatic varicose veins of bilateral lower extremities: Secondary | ICD-10-CM

## 2024-07-08 DIAGNOSIS — I83813 Varicose veins of bilateral lower extremities with pain: Secondary | ICD-10-CM | POA: Diagnosis present

## 2024-07-08 NOTE — Progress Notes (Signed)
 Patient ID: KYNSLEIGH WESTENDORF, female   DOB: [birth year], 68 y.o.   MRN: [id number]  Reason for Consult: New Patient (Initial Visit)   Referred by Royden Ronal Czar, FNP  Subjective:     HPI  RHILYNN PREYER is a 68 y.o. female who presents for evaluation of lower extremity varicosities Timeframe: Many years Symptoms: Aching and burning, some swelling towards the end of the day Varicosities: Yes Previous wounds: No Previous DVT: No In compression: No  Past Medical History:  Diagnosis Date   Asthma    Digestive disorder    GERD (gastroesophageal reflux disease)    High cholesterol    Hypertension    Joint pain    Lactose intolerance    Sleep apnea    Family History  Problem Relation Age of Onset   Thyroid  disease Mother    Diabetes Mother    Osteoarthritis Mother    Hypertension Mother    Heart disease Mother    Obesity Mother    Lung cancer Father    Alcoholism Father    Breast cancer Sister 67   Stroke Sister    Stroke Brother    Colon cancer Maternal Grandfather    Healthy Daughter    Stomach cancer Neg Hx    Rectal cancer Neg Hx    Esophageal cancer Neg Hx    Liver cancer Neg Hx    Pancreatic cancer Neg Hx    Past Surgical History:  Procedure Laterality Date   ESOPHAGOGASTRODUODENOSCOPY     TONSILLECTOMY     TUBAL LIGATION      Short Social History:  Social History   Tobacco Use   Smoking status: Some Days    Current packs/day: 0.50    Average packs/day: 0.5 packs/day for 4.7 years (2.3 ttl pk-yrs)    Types: Cigarettes    Start date: 2021   Smokeless tobacco: Never   Tobacco comments:    4-5 cigs/day currently  Substance Use Topics   Alcohol use: Not Currently    Allergies  Allergen Reactions   Lisinopril     Other reaction(s): Sore on lips   Sulfa Antibiotics Hives    Oral per patient can tolerate IV.     Current Outpatient Medications  Medication Sig Dispense Refill   acetaminophen (TYLENOL) 500 MG tablet Take 500 mg by mouth  every 6 (six) hours as needed.     Cholecalciferol (VITAMIN D3 PO) Take 1,000 Units by mouth daily.     famotidine  (PEPCID ) 20 MG tablet TAKE 1 TABLET BY MOUTH TWICE A DAY 180 tablet 1   ibuprofen (ADVIL) 400 MG tablet Take 400 mg by mouth every 6 (six) hours as needed.     lisinopril (ZESTRIL) 20 MG tablet Take 20 mg by mouth daily.     Multiple Vitamins-Minerals (WOMENS MULTIVITAMIN PO) Take by mouth. (Patient not taking: Reported on 05/18/2024)     pravastatin (PRAVACHOL) 40 MG tablet pravastatin 40 mg tablet     ZEPBOUND 2.5 MG/0.5ML Pen Inject 2.5 mg into the skin once a week.     No current facility-administered medications for this visit.    REVIEW OF SYSTEMS All other systems were reviewed and are negative    Objective:  Objective   Vitals:   07/08/24 1356  BP: (!) 141/89  Pulse: 74  Temp: 98 F (36.7 C)  SpO2: 98%  Weight: 187 lb (84.8 kg)  Height: 5' 5 (1.651 m)   Body mass index is 31.12 kg/m.  Physical  Exam General: no acute distress Cardiac: hemodynamically stable Pulm: normal work of breathing Neuro: alert, no focal deficit Extremities: Tender varicosities in lateral lower leg on the left, some small varicosities on the lateral aspect of the right leg.  Minimal edema, multiple telangiectasias throughout the legs bilaterally Vascular:   Right: palpable DP, PT  Left: palpable DP, PT   Data: Reflux study +--------------+---------+------+-----------+------------+------------+  LEFT         Reflux NoRefluxReflux TimeDiameter cmsComments                              Yes                                       +--------------+---------+------+-----------+------------+------------+  CFV                    yes   >1 second                           +--------------+---------+------+-----------+------------+------------+  FV mid                  yes   >1 second                            +--------------+---------+------+-----------+------------+------------+  Popliteal    no                                                  +--------------+---------+------+-----------+------------+------------+  GSV at SFJ              yes    >500 ms      0.70                  +--------------+---------+------+-----------+------------+------------+  GSV prox thighno                            0.52                  +--------------+---------+------+-----------+------------+------------+  GSV mid thigh           yes    >500 ms      0.33                  +--------------+---------+------+-----------+------------+------------+  GSV dist thighno                            0.25                  +--------------+---------+------+-----------+------------+------------+  GSV at knee   no                            0.30                  +--------------+---------+------+-----------+------------+------------+  GSV prox calf no                            0.22                  +--------------+---------+------+-----------+------------+------------+  GSV mid calf  no                            0.20                  +--------------+---------+------+-----------+------------+------------+  SSV at [facility]              yes    >500 ms      0.72                  +--------------+---------+------+-----------+------------+------------+  SSV prox calf           yes    >500 ms      0.62    varicosities  +--------------+---------+------+-----------+------------+------------+  SSV mid calf  no                            0.19                  +--------------+---------+------+-----------+------------+------------+  AASV O        no                            0.27                  +--------------+---------+------+-----------+------------+------------+       Assessment/Plan:     SIMARA RHYNER is a 68 y.o. female with chronic venous  insufficiency with C2s disease and reflux noted in left thigh GSV I explained the foundation of CVI treatment of compression and elevation I recommended medical grade graduated compression stockings and intermittent leg elevation We also discussed that many patients find symptom improvement with exercise   Follow-up as needed      Norman GORMAN Serve MD [facility]

## 2024-07-20 ENCOUNTER — Encounter: Payer: Self-pay | Admitting: Gastroenterology

## 2024-07-20 ENCOUNTER — Ambulatory Visit (INDEPENDENT_AMBULATORY_CARE_PROVIDER_SITE_OTHER): Admitting: Gastroenterology

## 2024-07-20 VITALS — BP 130/80 | HR 81 | Ht 65.0 in | Wt 191.2 lb

## 2024-07-20 DIAGNOSIS — K5900 Constipation, unspecified: Secondary | ICD-10-CM

## 2024-07-20 DIAGNOSIS — Z860100 Personal history of colon polyps, unspecified: Secondary | ICD-10-CM | POA: Diagnosis not present

## 2024-07-20 DIAGNOSIS — K219 Gastro-esophageal reflux disease without esophagitis: Secondary | ICD-10-CM

## 2024-07-20 DIAGNOSIS — R194 Change in bowel habit: Secondary | ICD-10-CM

## 2024-07-20 DIAGNOSIS — K2100 Gastro-esophageal reflux disease with esophagitis, without bleeding: Secondary | ICD-10-CM

## 2024-07-20 DIAGNOSIS — R1012 Left upper quadrant pain: Secondary | ICD-10-CM

## 2024-07-20 MED ORDER — NA SULFATE-K SULFATE-MG SULF 17.5-3.13-1.6 GM/177ML PO SOLN: 1.0000 | Freq: Once | ORAL | 0 refills | Status: AC

## 2024-07-20 NOTE — Progress Notes (Signed)
 Chief Complaint: stomach pain Primary GI Doctor: (previously Dr. Eda) Dr. Leigh  HPI:  Patient is a  68  year old female patient with past medical history of hyperlipidemia, hypertension, OSA on CPAP, obesity, and history of H pylori who presents for a evaluation of stomach .    Patient last seen by Alan, [place] in GI office on 04/23/22 for evaluation of GERD.  Interval History    Patient presents for evaluation of GERD, abdominal pain, and constipation. Patient has history of GERD with esophagitis. She complains of belching and LUQ abdominal pain. She states the pain starts as soon as she swallows her food and it hits the upper abdomen. She recently was placed on Omeprazole 20 mg po daily which has helped some. She was taking ginger chews and papaya enzymes to help with the discomfort as well. No dysphagia. Occasional nausea.   She reports she did complete a h pylori breath test few weeks ago with PCP off PPI therapy and negative result.  She was started on Zepbound in [place] 2025. She reports her abdominal pain occurred before starting this medication.   She notes taking NSAIDs, once every 2-3 mths as needed.   She also notes new issues with constipation, she has bowel movement every 2-3 days. She states it started about 2-3 months. No blood in stool.   No alcohol use. Smokes 5 cigarettes per day.  No blood thinners.  Surgical history: tonsillectomy, tubal ligation  Patient's family history includes: sister with breast CA, father with lung CA  Wt Readings from Last 3 Encounters:  07/20/24 191 lb 3.2 oz (86.7 kg)  07/08/24 187 lb (84.8 kg)  05/18/24 199 lb (90.3 kg)    Past Medical History:  Diagnosis Date   Asthma    Digestive disorder    GERD (gastroesophageal reflux disease)    High cholesterol    Hypertension    Joint pain    Lactose intolerance    Sleep apnea     Past Surgical History:  Procedure Laterality Date   ESOPHAGOGASTRODUODENOSCOPY      TONSILLECTOMY     TUBAL LIGATION      Current Outpatient Medications  Medication Sig Dispense Refill   acetaminophen (TYLENOL) 500 MG tablet Take 500 mg by mouth every 6 (six) hours as needed.     Cholecalciferol (VITAMIN D3 PO) Take 1,000 Units by mouth daily.     ibuprofen (ADVIL) 400 MG tablet Take 400 mg by mouth every 6 (six) hours as needed.     lisinopril (ZESTRIL) 20 MG tablet Take 20 mg by mouth daily.     Multiple Vitamins-Minerals (WOMENS MULTIVITAMIN PO) Take by mouth.     omeprazole (PRILOSEC) 40 MG capsule Take 40 mg by mouth every morning.     pravastatin (PRAVACHOL) 40 MG tablet pravastatin 40 mg tablet     ZEPBOUND 2.5 MG/0.5ML Pen Inject 2.5 mg into the skin once a week.     No current facility-administered medications for this visit.    Allergies as of 07/20/2024 - Review Complete 07/20/2024  Allergen Reaction Noted   Lisinopril  03/06/2022   Sulfa antibiotics Hives 03/12/2020    Family History  Problem Relation Age of Onset   Thyroid  disease Mother    Diabetes Mother    Osteoarthritis Mother    Hypertension Mother    Heart disease Mother    Obesity Mother    Lung cancer Father    Alcoholism Father    Breast cancer Sister 109  Stroke Sister    Stroke Brother    Colon cancer Maternal Grandfather    Healthy Daughter    Stomach cancer Neg Hx    Rectal cancer Neg Hx    Esophageal cancer Neg Hx    Liver cancer Neg Hx    Pancreatic cancer Neg Hx     Review of Systems:    Constitutional: No weight loss, fever, chills, weakness or fatigue HEENT: Eyes: No change in vision               Ears, Nose, Throat:  No change in hearing or congestion Skin: No rash or itching Cardiovascular: No chest pain, chest pressure or palpitations   Respiratory: No SOB or cough Gastrointestinal: See HPI and otherwise negative Genitourinary: No dysuria or change in urinary frequency Neurological: No headache, dizziness or syncope Musculoskeletal: No new muscle or joint  pain Hematologic: No bleeding or bruising Psychiatric: No history of depression or anxiety    Physical Exam:  Vital signs: BP 130/80   Pulse 81   Ht 5' 5 (1.651 m)   Wt 191 lb 3.2 oz (86.7 kg)   BMI 31.82 kg/m   Constitutional:   Pleasant  female appears to be in NAD, Well developed, Well nourished, alert and cooperative Throat: Oral cavity and pharynx without inflammation, swelling or lesion.  Respiratory: Respirations even and unlabored. Lungs clear to auscultation bilaterally.   No wheezes, crackles, or rhonchi.  Cardiovascular: Normal S1, S2. Regular rate and rhythm. No peripheral edema, cyanosis or pallor.  Gastrointestinal:  Soft, nondistended, nontender. No rebound or guarding. Normal bowel sounds. No appreciable masses or hepatomegaly. Rectal:  Not performed.  Msk:  Symmetrical without gross deformities. Without edema, no deformity or joint abnormality.  Neurologic:  Alert and  oriented x4;  grossly normal neurologically.  Skin:   Dry and intact without significant lesions or rashes.  RELEVANT LABS AND IMAGING: CBC    Latest Ref Rng & Units 12/07/2022   11:38 AM 05/01/2020   12:32 PM 03/21/2020   12:00 AM  CBC  WBC 4.0 - 10.5 K/uL 8.9  7.5  7.7      Hemoglobin 12.0 - 15.0 g/dL 87.4  87.8  88.2      Hematocrit 36.0 - 46.0 % 39.9  36.2  36      Platelets 150 - 400 K/uL 435  286  288         This result is from an external source.     CMP     Latest Ref Rng & Units 12/07/2022   11:38 AM 05/17/2020    8:10 AM 05/01/2020   12:32 PM  CMP  Glucose 70 - 99 mg/dL 91  94  CANCELED   BUN 8 - 23 mg/dL 10  14    Creatinine 9.55 - 1.00 mg/dL 9.19  9.19    Sodium 864 - 145 mmol/L 139  140    Potassium 3.5 - 5.1 mmol/L 4.2  4.5    Chloride 98 - 111 mmol/L 107  104    CO2 22 - 32 mmol/L 22  26    Calcium 8.9 - 10.3 mg/dL 9.3  9.2    Total Protein 6.5 - 8.1 g/dL 7.1  6.7    Total Bilirubin 0.3 - 1.2 mg/dL 0.9  0.5    Alkaline Phos 38 - 126 U/L 55  65    AST 15 - 41 U/L 24   20    ALT 0 - 44 U/L 30  20       Lab Results  Component Value Date   TSH 1.550 05/17/2020  06/2023 echo- Normal LV systolic function with EF 73%.   GI procedures/imaging:  Screening colonoscopy  Last done with Dr. Rosalie Ee with colon polyps 07/2019, due 07/2024  03/25/2022 upper endoscopy LA grade a reflux esophagitis, normal stomach, normal duodenum pathology showed H. pylori gastritis.   03/27/2020 abdominal ultrasound showing echogenic liver   04/2022 RUQ Ultrasound IMPRESSION: 1. The gallbladder and common bile duct are normal. 2. Probable hepatic steatosis with increased echogenicity in the liver.  Assessment: Encounter Diagnoses  Name Primary?   Gastroesophageal reflux disease with esophagitis, unspecified whether hemorrhage Yes   LUQ pain    Altered bowel habits    History of colonic polyps        68 year old female patient that presents with several gastrointestinal complaints.  Her first complaint is of uncontrolled GERD she recently was started on omeprazole 40 mg p.o. daily which has helped some.  Recommended the patient add Pepcid  at bedtime and avoid eating late at night.  Also explained to her that GLP-1's can potentially increase reflux symptoms as well.  She should consume small meals spread throughout the day.  Will go ahead and schedule upper GI endoscopy to evaluate for esophagitis and/or Barrett's.    Patient also complains of left upper quadrant pain that is worse with eating, she has had abdominal us  back in 2023 that was negative.  No gallstones or common bile duct dilatation.  Will go ahead and evaluate with endoscopic procedures and then can consider further testing afterwards.    Lastly patient has had issues recently with constipation she would like to do conservative measures first.  We discussed high-fiber diet with drinking plenty of fluids.  Also recommended she could try over-the-counter magnesium oxide or smooth move tea.  If that is not effective  then she Jadd Gasior consider over-the-counter MiraLAX. Patient due for colon screening colonoscopy for history of polyps, recommended the patient do colonoscopy with 2-day prep.  Will schedule in [facility] with Dr. Leigh  Plan: - continue Omeprazole 40 mg po daily -Can add pepcid  20 mg po daily at bedtime - Strict GERD diet, no late meals -Schedule EGD in [facility] with Dr. Leigh The risks and benefits of EGD with possible biopsies and esophageal dilation were discussed with the patient who agrees to proceed. -Increase high fiber diet - OTC Magnesium oxide 400-500mg  po daily, can take 1-3 capsule per day  - Schedule for a colonoscopy in [facility] with Dr. Leigh. The risks and benefits of colonoscopy with possible polypectomy / biopsies were discussed and the patient agrees to proceed. Recommended 2 day prep, patient delined -stop zepbound 1 week prior to procedures   Thank you for the courtesy of this consult. Please call me with any questions or concerns.   Jordis Repetto, FNP-C [facility] 07/20/2024, 9:47 AM  Cc: Royden Ronal Czar, FNP

## 2024-07-20 NOTE — Patient Instructions (Addendum)
 Recommend GERD diet Continue omeprazole 40mg  po daily Can add pepcid  20 mg po daily at bedtime  Constipation Increase high fiber diet Drink lots of water Activity as tolerated Can do otc smooth move tea or Magnesium oxide 400-500 mg po daily, can take 1-3 capsule per day  If no improvement we can discuss OTC MOM or Miralax  We have sent the following medications to your pharmacy for you to pick up at your convenience: SUPREP  You have been scheduled for an endoscopy and colonoscopy. Please follow the written instructions given to you at your visit today.  If you use inhalers (even only as needed), please bring them with you on the day of your procedure.  DO NOT TAKE 7 DAYS PRIOR TO TEST- Trulicity (dulaglutide) Ozempic, Wegovy (semaglutide) Mounjaro (tirzepatide) Bydureon Bcise (exanatide extended release)  DO NOT TAKE 1 DAY PRIOR TO YOUR TEST Rybelsus (semaglutide) Adlyxin (lixisenatide) Victoza (liraglutide) Byetta (exanatide) ___________________________________________________________________________  Due to recent changes in healthcare laws, you may see the results of your imaging and laboratory studies on MyChart before your provider has had a chance to review them.  We understand that in some cases there may be results that are confusing or concerning to you. Not all laboratory results come back in the same time frame and the provider may be waiting for multiple results in order to interpret others.  Please give us  48 hours in order for your provider to thoroughly review all the results before contacting the office for clarification of your results.   _______________________________________________________  If your blood pressure at your visit was 140/90 or greater, please contact your primary care physician to follow up on this.  _______________________________________________________  If you are age 104 or older, your body mass index should be between 23-30. Your Body  mass index is 31.82 kg/m. If this is out of the aforementioned range listed, please consider follow up with your Primary Care Provider.  If you are age 50 or younger, your body mass index should be between 19-25. Your Body mass index is 31.82 kg/m. If this is out of the aformentioned range listed, please consider follow up with your Primary Care Provider.   ________________________________________________________  The  GI providers would like to encourage you to use MYCHART to communicate with providers for non-urgent requests or questions.  Due to long hold times on the telephone, sending your provider a message by [facility] may be a faster and more efficient way to get a response.  Please allow 48 business hours for a response.  Please remember that this is for non-urgent requests.  _______________________________________________________  [facility] is using a team-based approach to care.  Your team is made up of your doctor and two to three APPS. Our APPS (Nurse Practitioners and Physician Assistants) work with your physician to ensure care continuity for you. They are fully qualified to address your health concerns and develop a treatment plan. They communicate directly with your gastroenterologist to care for you. Seeing the Advanced Practice Practitioners on your physician's team can help you by facilitating care more promptly, often allowing for earlier appointments, access to diagnostic testing, procedures, and other specialty referrals.   Thank you for trusting me with your gastrointestinal care. Deanna May, FNP-C

## 2024-07-20 NOTE — Progress Notes (Signed)
 Agree with assessment and plan with the following thoughts. Sounds like she is due for colonoscopy and agree with doing that with 2-day prep. Do not feel strongly that she needs an EGD right now if she had one just 2 years ago without any obvious Barrett's.  I think reasonable to escalate her reflux regimen and see how she does, reserve EGD for failure to improve,  unless she is anxious about this and really wanted for peace of mind.  Thanks

## 2024-08-30 ENCOUNTER — Encounter: Payer: Self-pay | Admitting: Gastroenterology

## 2024-09-06 ENCOUNTER — Ambulatory Visit (AMBULATORY_SURGERY_CENTER): Admitting: Gastroenterology

## 2024-09-06 ENCOUNTER — Encounter: Payer: Self-pay | Admitting: Gastroenterology

## 2024-09-06 VITALS — BP 106/56 | HR 81 | Temp 84.0°F | Resp 14 | Ht 65.0 in | Wt 191.0 lb

## 2024-09-06 DIAGNOSIS — K2100 Gastro-esophageal reflux disease with esophagitis, without bleeding: Secondary | ICD-10-CM | POA: Diagnosis not present

## 2024-09-06 DIAGNOSIS — Z1211 Encounter for screening for malignant neoplasm of colon: Secondary | ICD-10-CM | POA: Diagnosis not present

## 2024-09-06 DIAGNOSIS — K3189 Other diseases of stomach and duodenum: Secondary | ICD-10-CM

## 2024-09-06 DIAGNOSIS — K5710 Diverticulosis of small intestine without perforation or abscess without bleeding: Secondary | ICD-10-CM | POA: Diagnosis not present

## 2024-09-06 DIAGNOSIS — K5730 Diverticulosis of large intestine without perforation or abscess without bleeding: Secondary | ICD-10-CM | POA: Diagnosis not present

## 2024-09-06 DIAGNOSIS — D123 Benign neoplasm of transverse colon: Secondary | ICD-10-CM

## 2024-09-06 DIAGNOSIS — Z860100 Personal history of colon polyps, unspecified: Secondary | ICD-10-CM

## 2024-09-06 DIAGNOSIS — Z860101 Personal history of adenomatous and serrated colon polyps: Secondary | ICD-10-CM

## 2024-09-06 DIAGNOSIS — K648 Other hemorrhoids: Secondary | ICD-10-CM

## 2024-09-06 DIAGNOSIS — K222 Esophageal obstruction: Secondary | ICD-10-CM

## 2024-09-06 DIAGNOSIS — R1010 Upper abdominal pain, unspecified: Secondary | ICD-10-CM

## 2024-09-06 DIAGNOSIS — D214 Benign neoplasm of connective and other soft tissue of abdomen: Secondary | ICD-10-CM

## 2024-09-06 DIAGNOSIS — K635 Polyp of colon: Secondary | ICD-10-CM

## 2024-09-06 DIAGNOSIS — D120 Benign neoplasm of cecum: Secondary | ICD-10-CM

## 2024-09-06 MED ORDER — OMEPRAZOLE 40 MG PO CPDR: 40.0000 mg | DELAYED_RELEASE_CAPSULE | Freq: Every day | ORAL | 3 refills | Status: AC

## 2024-09-06 MED ORDER — SODIUM CHLORIDE 0.9 % IV SOLN: 500.0000 mL | Freq: Once | INTRAVENOUS | Status: DC

## 2024-09-06 NOTE — Progress Notes (Signed)
 Called to room to assist during endoscopic procedure.  Patient ID and intended procedure confirmed with present staff. Received instructions for my participation in the procedure from the performing physician.

## 2024-09-06 NOTE — Op Note (Signed)
 [facility] Patient Name: Danielle Mcdonald Procedure Date: 09/06/2024 10:57 AM MRN: [id number] Endoscopist: Elspeth P. Leigh , MD, [id number] Age: 68 Referring MD:  Date of Birth: 1956/05/27 Gender: Female Account #: [id number] Procedure:                Colonoscopy Indications:              High risk colon cancer surveillance: Personal                            history of colonic polyps - adenoma / sessile                            serrated polyp removed per Dr. Rosalie [facility]) in                            07/2019 Medicines:                Monitored Anesthesia Care Procedure:                Pre-Anesthesia Assessment:                           - Prior to the procedure, a History and Physical                            was performed, and patient medications and                            allergies were reviewed. The patient's tolerance of                            previous anesthesia was also reviewed. The risks                            and benefits of the procedure and the sedation                            options and risks were discussed with the patient.                            All questions were answered, and informed consent                            was obtained. Prior Anticoagulants: The patient has                            taken no anticoagulant or antiplatelet agents. ASA                            Grade Assessment: II - A patient with mild systemic                            disease. After reviewing the risks and benefits,  the patient was deemed in satisfactory condition to                            undergo the procedure.                           After obtaining informed consent, the colonoscope                            was passed under direct vision. Throughout the                            procedure, the patient's blood pressure, pulse, and                            oxygen saturations were monitored continuously. The                             Olympus Scope SN: [id number] was introduced through                            the anus and advanced to the the cecum, identified                            by appendiceal orifice and ileocecal valve. The                            colonoscopy was performed without difficulty. The                            patient tolerated the procedure well. The quality                            of the bowel preparation was good. The ileocecal                            valve, appendiceal orifice, and rectum were                            photographed. Scope In: 11:27:21 AM Scope Out: 11:44:39 AM Scope Withdrawal Time: 0 hours 13 minutes 29 seconds  Total Procedure Duration: 0 hours 17 minutes 18 seconds  Findings:                 The perianal and digital rectal examinations were                            normal.                           A diminutive polyp was found in the cecum. The                            polyp was flat. The polyp was removed with a cold  snare. Resection and retrieval were complete.                           Three flat and sessile polyps were found in the                            transverse colon. The polyps were 3 to 5 mm in                            size. These polyps were removed with a cold snare.                            Resection and retrieval were complete.                           A few small-mouthed diverticula were found in the                            transverse colon.                           Internal hemorrhoids were found. The hemorrhoids                            were small.                           The exam was otherwise without abnormality. Complications:            No immediate complications. Estimated blood loss:                            Minimal. Estimated Blood Loss:     Estimated blood loss was minimal. Impression:               - One diminutive polyp in the cecum, removed with a                             cold snare. Resected and retrieved.                           - Three 3 to 5 mm polyps in the transverse colon,                            removed with a cold snare. Resected and retrieved.                           - Diverticulosis in the transverse colon.                           - Internal hemorrhoids.                           - The examination was otherwise normal. Recommendation:           - Patient has a contact number  available for                            emergencies. The signs and symptoms of potential                            delayed complications were discussed with the                            patient. Return to normal activities tomorrow.                            Written discharge instructions were provided to the                            patient.                           - Resume previous diet.                           - Continue present medications.                           - Await pathology results. Elspeth P. Khameron Gruenwald, MD 09/06/2024 11:49:45 AM This report has been signed electronically.

## 2024-09-06 NOTE — Progress Notes (Signed)
 Pt's states no medical or surgical changes since previsit or office visit.

## 2024-09-06 NOTE — Progress Notes (Signed)
 [facility] History and Physical   Primary Care Physician:  Royden Ronal Czar, FNP   Reason for Procedure:   GERD, upper abdominal pain, history of colon polyps  Plan:    EGD and colonoscopy     HPI: Danielle Mcdonald is a 68 y.o. female  here for EGD and colonoscopy - see office note 07/20/24 for details. History of GERD and intermittent upper abdominal pain, placed on omeprazole 40mg  / day and added pepcid  at bedtime. EGD to further evaluate. Remote history of H pylori gastritis.  She is doing better on the regimen since office visit.  Had a colonoscopy with [facility] 07/2019 - a few small polyps removed, SSP and TA. Has constipation at baseline  Otherwise feels well without any cardiopulmonary symptoms.   I have discussed risks / benefits of anesthesia and endoscopic procedure with [place] P Rossitto and they wish to proceed with the exams as outlined today.    Past Medical History:  Diagnosis Date   Asthma    Digestive disorder    GERD (gastroesophageal reflux disease)    High cholesterol    Hypertension    Joint pain    Lactose intolerance    Sleep apnea     Past Surgical History:  Procedure Laterality Date   ESOPHAGOGASTRODUODENOSCOPY     TONSILLECTOMY     TUBAL LIGATION      Prior to Admission medications   Medication Sig Start Date End Date Taking? Authorizing Provider  Cholecalciferol (VITAMIN D3 PO) Take 1,000 Units by mouth daily.   Yes [provider]  lisinopril (ZESTRIL) 20 MG tablet Take 20 mg by mouth daily.   Yes [provider]  Multiple Vitamins-Minerals (WOMENS MULTIVITAMIN PO) Take by mouth.   Yes [provider]  pravastatin (PRAVACHOL) 40 MG tablet pravastatin 40 mg tablet   Yes [provider]  rosuvastatin (CRESTOR) 20 MG tablet Take 20 mg by mouth daily as needed. 08/02/24  Yes [provider]  acetaminophen (TYLENOL) 500 MG tablet Take 500 mg by mouth every 6 (six) hours as needed.     [provider]  albuterol (VENTOLIN HFA) 108 (90 Base) MCG/ACT inhaler INHALE 2 PUFFS INTO THE LUNGS EVERY 4 HOURS AS NEEDED    [provider]  ibuprofen (ADVIL) 400 MG tablet Take 400 mg by mouth every 6 (six) hours as needed.    [provider]  omeprazole (PRILOSEC) 40 MG capsule Take 40 mg by mouth daily.    [provider]  triamcinolone cream (KENALOG) 0.1 % PLEASE SEE ATTACHED FOR DETAILED DIRECTIONS    [provider]  ZEPBOUND 2.5 MG/0.5ML Pen Inject 2.5 mg into the skin once a week. 04/07/24   [provider]    Current Outpatient Medications  Medication Sig Dispense Refill   Cholecalciferol (VITAMIN D3 PO) Take 1,000 Units by mouth daily.     lisinopril (ZESTRIL) 20 MG tablet Take 20 mg by mouth daily.     Multiple Vitamins-Minerals (WOMENS MULTIVITAMIN PO) Take by mouth.     pravastatin (PRAVACHOL) 40 MG tablet pravastatin 40 mg tablet     rosuvastatin (CRESTOR) 20 MG tablet Take 20 mg by mouth daily as needed.     acetaminophen (TYLENOL) 500 MG tablet Take 500 mg by mouth every 6 (six) hours as needed.     albuterol (VENTOLIN HFA) 108 (90 Base) MCG/ACT inhaler INHALE 2 PUFFS INTO THE LUNGS EVERY 4 HOURS AS NEEDED     ibuprofen (ADVIL) 400 MG  tablet Take 400 mg by mouth every 6 (six) hours as needed.     omeprazole (PRILOSEC) 40 MG capsule Take 40 mg by mouth daily.     triamcinolone cream (KENALOG) 0.1 % PLEASE SEE ATTACHED FOR DETAILED DIRECTIONS     ZEPBOUND 2.5 MG/0.5ML Pen Inject 2.5 mg into the skin once a week.     Current Facility-Administered Medications  Medication Dose Route Frequency Provider Last Rate Last Admin   0.9 %  sodium chloride  infusion  500 mL Intravenous Once Judea Fennimore, Elspeth SQUIBB, MD        Allergies as of 09/06/2024 - Review Complete 09/06/2024  Allergen Reaction Noted   Lisinopril  03/06/2022   Sulfa antibiotics Hives 03/12/2020    Family History  Problem Relation Age of Onset   Thyroid   disease Mother    Diabetes Mother    Osteoarthritis Mother    Hypertension Mother    Heart disease Mother    Obesity Mother    Lung cancer Father    Alcoholism Father    Breast cancer Sister 34   Stroke Sister    Stroke Brother    Colon cancer Maternal Grandfather    Healthy Daughter    Stomach cancer Neg Hx    Rectal cancer Neg Hx    Esophageal cancer Neg Hx    Liver cancer Neg Hx    Pancreatic cancer Neg Hx     Social History   Socioeconomic History   Marital status: Married    Spouse name: Danielle Mcdonald    Number of children: 1   Years of education: Not on file   Highest education level: Not on file  Occupational History   Occupation: Retired    Comment: IT  Tobacco Use   Smoking status: Some Days    Current packs/day: 0.50    Average packs/day: 0.5 packs/day for 4.8 years (2.4 ttl pk-yrs)    Types: Cigarettes    Start date: 2021   Smokeless tobacco: Never   Tobacco comments:    4-5 cigs/day currently  Vaping Use   Vaping status: Never Used  Substance and Sexual Activity   Alcohol use: Not Currently   Drug use: Never   Sexual activity: Not on file  Other Topics Concern   Not on file  Social History Narrative   Not on file   Social Drivers of Health   Financial Resource Strain: Not on file  Food Insecurity: Not on file  Transportation Needs: Not on file  Physical Activity: Not on file  Stress: Not on file  Social Connections: Not on file  Intimate Partner Violence: Not on file    Review of Systems: All other review of systems negative except as mentioned in the HPI.  Physical Exam: Vital signs BP 127/86   Pulse 68   Temp 97.7 F (36.5 C)   Ht 5' 5 (1.651 m)   Wt 191 lb (86.6 kg)   SpO2 99%   BMI 31.78 kg/m   General:   Alert,  Well-developed, pleasant and cooperative in NAD Lungs:  Clear throughout to auscultation.   Heart:  Regular rate and rhythm Abdomen:  Soft, nontender and nondistended.   Neuro/Psych:  Alert and cooperative.  Normal mood and affect. A and O x 3  Marcey Naval, MD [facility]

## 2024-09-06 NOTE — Progress Notes (Signed)
 To PACU via stretcher, sedated, good respiratory effort, VSS.

## 2024-09-06 NOTE — Patient Instructions (Addendum)
 Take omeprazole twice a day for 6 weeks and then decrease to once a day  YOU HAD AN ENDOSCOPIC PROCEDURE TODAY AT THE [facility]:   Refer to the procedure report that was given to you for any specific questions about what was found during the examination.  If the procedure report does not answer your questions, please call your gastroenterologist to clarify.  If you requested that your care partner not be given the details of your procedure findings, then the procedure report has been included in a sealed envelope for you to review at your convenience later.  YOU SHOULD EXPECT: Some feelings of bloating in the abdomen. Passage of more gas than usual.  Walking can help get rid of the air that was put into your GI tract during the procedure and reduce the bloating. If you had a lower endoscopy (such as a colonoscopy or flexible sigmoidoscopy) you may notice spotting of blood in your stool or on the toilet paper. If you underwent a bowel prep for your procedure, you may not have a normal bowel movement for a few days.  Please Note:  You might notice some irritation and congestion in your nose or some drainage.  This is from the oxygen used during your procedure.  There is no need for concern and it should clear up in a day or so.  SYMPTOMS TO REPORT IMMEDIATELY:  Following lower endoscopy (colonoscopy or flexible sigmoidoscopy):  Excessive amounts of blood in the stool  Significant tenderness or worsening of abdominal pains  Swelling of the abdomen that is new, acute  Fever of 100F or higher  Following upper endoscopy (EGD)  Vomiting of blood or coffee ground material  New chest pain or pain under the shoulder blades  Painful or persistently difficult swallowing  New shortness of breath  Fever of 100F or higher  Black, tarry-looking stools  For urgent or emergent issues, a gastroenterologist can be reached at any hour by calling [phone]. Do not use MyChart messaging for  urgent concerns.    DIET:  We do recommend a small meal at first, but then you may proceed to your regular diet.  Drink plenty of fluids but you should avoid alcoholic beverages for 24 hours.  ACTIVITY:  You should plan to take it easy for the rest of today and you should NOT DRIVE or use heavy machinery until tomorrow (because of the sedation medicines used during the test).    FOLLOW UP: Our staff will call the number listed on your records the next business day following your procedure.  We will call around 7:15- 8:00 am to check on you and address any questions or concerns that you may have regarding the information given to you following your procedure. If we do not reach you, we will leave a message.     If any biopsies were taken you will be contacted by phone or by letter within the next 1-3 weeks.  Please call us  at [phone] if you have not heard about the biopsies in 3 weeks.    SIGNATURES/CONFIDENTIALITY: You and/or your care partner have signed paperwork which will be entered into your electronic medical record.  These signatures attest to the fact that that the information above on your After Visit Summary has been reviewed and is understood.  Full responsibility of the confidentiality of this discharge information lies with you and/or your care-partner.

## 2024-09-06 NOTE — Op Note (Signed)
 [facility] Patient Name: Danielle Mcdonald Procedure Date: 09/06/2024 11:06 AM MRN: [id number] Endoscopist: Elspeth P. Leigh , MD, [id number] Age: 68 Referring MD:  Date of Birth: 03-31-1956 Gender: Female Account #: [id number] Procedure:                Upper GI endoscopy Indications:              Upper abdominal pain, Follow-up of                            gastro-esophageal reflux disease - on omeprazole                            40mg  / day and pepcid  added recently with                            improvement of symptoms. History of H pylori. Medicines:                Monitored Anesthesia Care Procedure:                Pre-Anesthesia Assessment:                           - Prior to the procedure, a History and Physical                            was performed, and patient medications and                            allergies were reviewed. The patient's tolerance of                            previous anesthesia was also reviewed. The risks                            and benefits of the procedure and the sedation                            options and risks were discussed with the patient.                            All questions were answered, and informed consent                            was obtained. Prior Anticoagulants: The patient has                            taken no anticoagulant or antiplatelet agents. ASA                            Grade Assessment: II - A patient with mild systemic                            disease. After reviewing the risks and benefits,  the patient was deemed in satisfactory condition to                            undergo the procedure.                           After obtaining informed consent, the endoscope was                            passed under direct vision. Throughout the                            procedure, the patient's blood pressure, pulse, and                            oxygen saturations were  monitored continuously. The                            Olympus Scope SN [id number] was introduced through the                            mouth, and advanced to the second part of duodenum.                            The upper GI endoscopy was accomplished without                            difficulty. The patient tolerated the procedure                            well. Scope In: Scope Out: Findings:                 Esophagogastric landmarks were identified: the                            Z-line was found at 35 cm, the gastroesophageal                            junction was found at 35 cm and the upper extent of                            the gastric folds was found at 38 cm from the                            incisors.                           A 3 cm hiatal hernia was present. There was                            significant laxity at the GEJ due to the hernia.                           A  widely patent Schatzki ring was found at the                            gastroesophageal junction.                           LA Grade A esophagitis was found at the GEJ.                           The exam of the esophagus was otherwise normal.                           A single 6 to 7 mm subepithelial papule (nodule)                            was found in the gastric fundus. Biopsies were                            taken with a cold forceps for histology. It did not                            appear to be lipomatous following biopsies.                           The exam of the stomach was otherwise normal.                           Biopsies were taken with a cold forceps for                            Helicobacter pylori testing.                           A small diverticulum was found in the second                            portion of the duodenum.                           The exam of the duodenum was otherwise normal. Complications:            No immediate complications. Estimated blood loss:                             Minimal. Estimated Blood Loss:     Estimated blood loss was minimal. Impression:               - Esophagogastric landmarks identified.                           - 3 cm hiatal hernia.                           - Widely patent Schatzki ring.                           -  LA Grade A reflux esophagitis.                           - Normal esophagus otherwise.                           - A single subepithelial papule (nodule) found in                            the stomach. Biopsied.                           - Normal stomach otherwise - biopsies to assess for                            H pylori                           - Duodenal diverticulum.                           - Normal duodenum otherwise.                           Given endoscopic findings and interval improvement                            on escalation of antacids, suspect symptoms reflux                            / esophagitis related. Recommendation:           - Patient has a contact number available for                            emergencies. The signs and symptoms of potential                            delayed complications were discussed with the                            patient. Return to normal activities tomorrow.                            Written discharge instructions were provided to the                            patient.                           - Resume previous diet.                           - Continue present medications.                           - Increase omeprazole to twice daily dosing for 4-6  weeks, long term use the lowest daily dose needed                            to control symptoms.                           - Await pathology results.                           - Consideration for EUS regarding nodule in the                            fundus, will discuss with the patient. Elspeth P. Seletha Zimmermann, MD 09/06/2024 11:54:19 AM This report has been signed  electronically.

## 2024-09-07 ENCOUNTER — Telehealth: Payer: Self-pay

## 2024-09-07 NOTE — Telephone Encounter (Signed)
 Left message

## 2024-09-09 LAB — SURGICAL PATHOLOGY

## 2024-09-11 ENCOUNTER — Ambulatory Visit: Payer: Self-pay | Admitting: Gastroenterology
